# Patient Record
Sex: Female | Born: 1975 | Race: Black or African American | Hispanic: No | Smoking: Former smoker
Health system: Southern US, Community
[De-identification: ages and names within clinical notes are randomized; demographics above are authoritative.]

## PROBLEM LIST (undated history)

## (undated) DIAGNOSIS — R61 Generalized hyperhidrosis: Secondary | ICD-10-CM

## (undated) DIAGNOSIS — I10 Essential (primary) hypertension: Secondary | ICD-10-CM

## (undated) DIAGNOSIS — E039 Hypothyroidism, unspecified: Secondary | ICD-10-CM

## (undated) DIAGNOSIS — R42 Dizziness and giddiness: Secondary | ICD-10-CM

## (undated) DIAGNOSIS — I4891 Unspecified atrial fibrillation: Secondary | ICD-10-CM

## (undated) DIAGNOSIS — R110 Nausea: Secondary | ICD-10-CM

## (undated) DIAGNOSIS — D649 Anemia, unspecified: Secondary | ICD-10-CM

## (undated) DIAGNOSIS — R0602 Shortness of breath: Secondary | ICD-10-CM

## (undated) DIAGNOSIS — Z8659 Personal history of other mental and behavioral disorders: Secondary | ICD-10-CM

## (undated) DIAGNOSIS — E042 Nontoxic multinodular goiter: Secondary | ICD-10-CM

## (undated) DIAGNOSIS — F319 Bipolar disorder, unspecified: Secondary | ICD-10-CM

## (undated) HISTORY — DX: Bipolar disorder, unspecified: F31.9

## (undated) HISTORY — DX: Nausea: R11.0

## (undated) HISTORY — DX: Generalized hyperhidrosis: R61

## (undated) HISTORY — DX: Unspecified atrial fibrillation: I48.91

## (undated) HISTORY — DX: Nontoxic multinodular goiter: E04.2

## (undated) HISTORY — DX: Personal history of other mental and behavioral disorders: Z86.59

## (undated) HISTORY — DX: Dizziness and giddiness: R42

## (undated) HISTORY — DX: Shortness of breath: R06.02

---

## 2000-11-11 ENCOUNTER — Emergency Department (HOSPITAL_COMMUNITY): Admission: EM | Admit: 2000-11-11 | Discharge: 2000-11-11 | Payer: Self-pay | Admitting: Emergency Medicine

## 2001-02-02 ENCOUNTER — Emergency Department (HOSPITAL_COMMUNITY): Admission: EM | Admit: 2001-02-02 | Discharge: 2001-02-02 | Payer: Self-pay | Admitting: *Deleted

## 2004-03-20 ENCOUNTER — Emergency Department (HOSPITAL_COMMUNITY): Admission: EM | Admit: 2004-03-20 | Discharge: 2004-03-20 | Payer: Self-pay | Admitting: Emergency Medicine

## 2004-06-24 ENCOUNTER — Emergency Department (HOSPITAL_COMMUNITY): Admission: EM | Admit: 2004-06-24 | Discharge: 2004-06-24 | Payer: Self-pay | Admitting: Emergency Medicine

## 2005-05-19 ENCOUNTER — Inpatient Hospital Stay (HOSPITAL_COMMUNITY): Admission: RE | Admit: 2005-05-19 | Discharge: 2005-05-22 | Payer: Self-pay | Admitting: Psychiatry

## 2005-05-19 ENCOUNTER — Ambulatory Visit: Payer: Self-pay | Admitting: Psychiatry

## 2005-08-27 ENCOUNTER — Ambulatory Visit (HOSPITAL_COMMUNITY): Payer: Self-pay | Admitting: Psychiatry

## 2005-10-14 ENCOUNTER — Emergency Department (HOSPITAL_COMMUNITY): Admission: EM | Admit: 2005-10-14 | Discharge: 2005-10-14 | Payer: Self-pay | Admitting: Emergency Medicine

## 2005-10-27 ENCOUNTER — Ambulatory Visit (HOSPITAL_COMMUNITY): Payer: Self-pay | Admitting: Psychiatry

## 2006-05-12 ENCOUNTER — Ambulatory Visit: Payer: Self-pay | Admitting: Psychiatry

## 2006-05-12 ENCOUNTER — Inpatient Hospital Stay (HOSPITAL_COMMUNITY): Admission: AD | Admit: 2006-05-12 | Discharge: 2006-05-14 | Payer: Self-pay | Admitting: Psychiatry

## 2006-07-10 ENCOUNTER — Inpatient Hospital Stay (HOSPITAL_COMMUNITY): Admission: EM | Admit: 2006-07-10 | Discharge: 2006-07-14 | Payer: Self-pay | Admitting: Emergency Medicine

## 2006-07-14 ENCOUNTER — Inpatient Hospital Stay (HOSPITAL_COMMUNITY): Admission: AD | Admit: 2006-07-14 | Discharge: 2006-07-21 | Payer: Self-pay | Admitting: *Deleted

## 2006-07-14 ENCOUNTER — Ambulatory Visit: Payer: Self-pay | Admitting: *Deleted

## 2006-08-04 ENCOUNTER — Inpatient Hospital Stay (HOSPITAL_COMMUNITY): Admission: RE | Admit: 2006-08-04 | Discharge: 2006-08-25 | Payer: Self-pay | Admitting: *Deleted

## 2006-08-04 ENCOUNTER — Other Ambulatory Visit: Payer: Self-pay | Admitting: Emergency Medicine

## 2006-08-04 ENCOUNTER — Ambulatory Visit: Payer: Self-pay | Admitting: *Deleted

## 2006-08-26 ENCOUNTER — Emergency Department (HOSPITAL_COMMUNITY): Admission: EM | Admit: 2006-08-26 | Discharge: 2006-08-26 | Payer: Self-pay | Admitting: Emergency Medicine

## 2006-08-28 ENCOUNTER — Ambulatory Visit (HOSPITAL_COMMUNITY): Payer: Self-pay | Admitting: Psychiatry

## 2006-09-05 ENCOUNTER — Emergency Department (HOSPITAL_COMMUNITY): Admission: EM | Admit: 2006-09-05 | Discharge: 2006-09-06 | Payer: Self-pay | Admitting: Emergency Medicine

## 2006-09-07 ENCOUNTER — Ambulatory Visit (HOSPITAL_COMMUNITY): Payer: Self-pay | Admitting: Psychiatry

## 2007-04-30 ENCOUNTER — Emergency Department (HOSPITAL_COMMUNITY): Admission: EM | Admit: 2007-04-30 | Discharge: 2007-04-30 | Payer: Self-pay | Admitting: Emergency Medicine

## 2007-12-06 ENCOUNTER — Emergency Department (HOSPITAL_COMMUNITY): Admission: EM | Admit: 2007-12-06 | Discharge: 2007-12-06 | Payer: Self-pay | Admitting: Emergency Medicine

## 2008-04-12 ENCOUNTER — Emergency Department (HOSPITAL_COMMUNITY): Admission: EM | Admit: 2008-04-12 | Discharge: 2008-04-12 | Payer: Self-pay | Admitting: Emergency Medicine

## 2009-06-07 ENCOUNTER — Emergency Department (HOSPITAL_COMMUNITY): Admission: EM | Admit: 2009-06-07 | Discharge: 2009-06-07 | Payer: Self-pay | Admitting: Emergency Medicine
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
## 2010-07-16 NOTE — Consult Note (Signed)
NAMECALIANN, Ashley Jones NO.:  [id number]   MEDICAL RECORD NO.:  [id number]          PATIENT TYPE:  INP   LOCATION:  1405                         FACILITY:  [facility]   PHYSICIAN:  Antonietta Breach, M.D.  DATE OF BIRTH:  Jun 28, 1975   DATE OF CONSULTATION:  07/13/2006  DATE OF DISCHARGE:                                 CONSULTATION   CONSULTATION FOLLOWUP:  Ashley Jones is no longer agitated.  She is cooperative with bedside  care.  She continues to have judgment impairment.  She continues to have  grandiose delusions and some slight thought disorganization.  She is  taking her medicines cooperatively.  She is not having any adverse  medication effects.   She last required Ativan at 2 mg last night.   WBC 7.9, hemoglobin 11, platelet count 270. BUN 3, creatinine 0.7,  calcium 8.8.  Magnesium 2.1.  TSH is slightly decreased at 0.158.   PHYSICAL EXAMINATION:  VITAL SIGNS: Temperature 98.3, pulse 91,  respirations 79,. blood pressure 129/94.  O2 saturation on room air 98%.   MENTAL STATUS EXAM:  Ashley Jones has intact eye contact.  She is  mildly disheveled in appearance.  Her affect is flat.  Thought process  is partially disorganized at times but overall coherent.  On thought  content, she has grandiosity as mentioned.  Her judgment is still  impaired.  She is oriented to all spheres.  Her memory is intact.  She  does not have any thoughts of harming herself or others.   ASSESSMENT:  1. (293.82) psychotic disorder not otherwise specified.  2. Rule out (296.80) bipolar disorder not otherwise specified.   RECOMMENDATIONS:  1. Would continue the Depakote at 500 mg b.i.d. as her primary mood      stabilizer.  2. Would continue the Zyprexa trial at 10 mg nightly for anti-      psychosis.  3. The husband has stated that he cannot be at home with her;      therefore, the option of intensive outpatient at this time and the      patient going home is not recommended.   Would recommend that the      patient be admitted to a psychiatric unit for further treatment      until she can perform her ADLs independently and reside at home      independently.  At this point, she would be at risk for self      neglect.  However, she has improved.      Antonietta Breach, M.D.  Electronically Signed     JW/MEDQ  D:  07/13/2006  T:  07/13/2006  Job:  [id number]

## 2010-07-16 NOTE — Discharge Summary (Signed)
Ashley Jones, Ashley Jones NO.:  [id number]   MEDICAL RECORD NO.:  [id number]          PATIENT TYPE:  IPS   LOCATION:  0405                          FACILITY:  [facility]   PHYSICIAN:  Jasmine Pang, M.D. DATE OF BIRTH:  03-12-1975   DATE OF ADMISSION:  07/14/2006  DATE OF DISCHARGE:  07/20/2006                               DISCHARGE SUMMARY   IDENTIFYING INFORMATION:  A 35 year old married African American female  who was identified on a voluntary basis on Jul 14, 2006.   HISTORY OF PRESENT ILLNESS:  The patient presented with disorganized  thoughts and confusion.  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  She is asking for supplies to  write down her thoughts.  She is unable to attend for the interview and  reports that her only physician is God.  She has previously followed  by Dr. Lolly Mustache in the past at [facility].  This is one of several [facility]  admissions for her.  She has a history of bipolar disorder with manic  features.  She previously was stabilized on Depakote 500 mg p.o. b.i.d.  and Zyprexa 10 mg p.o. at bedtime.   PAST MEDICAL HISTORY:  The patient has a past medical history of anemia  and UTIs; none now.   ALLERGIES:  No known drug allergies.   PHYSICAL EXAMINATION:  The patient's physical exam was done at the  hospital prior to admission.  She was disheveled, obese [place]  female who was in no acute medical or physical distress.   LABORATORY DATA:  Admission laboratories were done in the ED prior to  admission.  These were reviewed by the ED physician.   HOSPITAL COURSE:  Upon admission the patient was started on her home  medications of Depakote 500 mg p.o. b.i.d., Zyprexa 10 mg p.o. at  bedtime, Catapres 1.1 mg p.o. b.i.d., Ativan 1 mg q.6h. p.r.n. anxiety,  and Haldol 2 mg q.6h. p.r.n. anxiety.  On Jul 15, 2006, Zyprexa was  increased  to 15 mg p.o. at bedtime.  An a.m. Depakote level was obtained  which was 75.7 (50-100).  The patient tolerated her medications well  with no significant side effects.  During the hospitalization, the  patient was friendly and cooperative.  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  She discussed conflict with  her husband about her music.  She felt he was being judgmental because  he was disputing her claims.  There was some disorganized thinking.  An  a.m. Depakote level upon admission was 97.4 and (50-100).  Hepatic  profile and CBC were within normal limits.  The olanzapine was increased  to 15 mg p.o. at bedtime from 10 mg p.o. at bedtime. The patient began  to clear on Jul 17, 2006.  She was more coherent.  Her husband was  interested in having her come home soon.  There was no suicidal or  homicidal ideation.  No auditory  or visual hallucinations.  There was no  more delusional talk about being a Nurse, learning disability.  On Jul 18, 2006,  the patient was excited about her discharge on Jul 20, 2006.  She states  her husband knows she is doing better.  There was no change in treatment  plan.  The patient continued to do well on Jul 19, 2006.  On Jul 20, 2006, mental status had improved markedly from admission.  She was  friendly, cooperative, with good eye contact.  Speech was normal rate  and flow.  Psychomotor activity was within normal limits.  The mood was  euthymic.  Affect wide range.  No suicidal or homicidal ideation.  No  thoughts of self injurious behavior.  No auditory or visual  hallucinations.  No paranoia or delusions.  Thoughts were logical and  goal-directed.  Thought content had no predominant theme.  Cognitive was  grossly back to baseline.  It was felt the patient was safe to be  discharged today to her husband.   DISCHARGE DIAGNOSES:  AXIS I:  Bipolar disorder, manic severe with  psychosis.  AXIS II:  None.  AXIS III:  None  AXIS  IV:  Moderate (problems with psychosocial environment, problems  with primary support group, burden of psychiatric illness).  AXIS V: GAF  upon discharge was 50.  GAF upon admission was 35.  GAF highest past  year was 65.   DISCHARGE/PLAN:  There were no specific activity level or dietary  restrictions.  The patient will be seen at the intensive outpatient  program at [facility].  She will start this tomorrow on Jul 21, 2006.   DISCHARGE MEDICATIONS:  1. Depakote 500 mg tablets twice daily.  2. Clonidine 0.1 mg twice daily.  3. Zyprexa 15 mg at bedtime.      Jasmine Pang, M.D.  Electronically Signed     BHS/MEDQ  D:  07/20/2006  T:  07/20/2006  Job:  [id number]

## 2010-07-16 NOTE — Discharge Summary (Signed)
NAMEJOANELL, Ashley Jones NO.:  [id number]   MEDICAL RECORD NO.:  [id number]          PATIENT TYPE:  IPS   LOCATION:  0305                          FACILITY:  [facility]   PHYSICIAN:  Anselm Jungling, MD  DATE OF BIRTH:  [birth year]   DATE OF ADMISSION:  08/04/2006  DATE OF DISCHARGE:  08/25/2006                               DISCHARGE SUMMARY   IDENTIFYING DATA/REASON FOR ADMISSION:  The patient is a 35 year old  African-American female, who had recently had a course of treatment at  our inpatient psychiatric service.  She returned with decompensation of  psychosis and mood disorder.  At the time of her previous discharge she  had been stable and discharged on a regimen of Zyprexa, Depakote and  Clonidine.  The patient reported that she had gotten mixed up on  taking her medication and following this had had increasing symptoms  including auditory hallucinations, and severe thought disorganization.  She denied any illicit drug or alcohol use, and was not suicidal.  Please refer to the admission note for further details pertaining to the  symptoms, circumstances and history that led to her hospitalization.  She was given an initial Axis I diagnosis of schizo-affective disorder  NOS.   MEDICAL AND LABORATORY:  The patient was again medically and physically  assessed by the psychiatric nurse practitioner.  Please refer to  hospital course below for further details.   HOSPITAL COURSE:  The patient was admitted to the adult inpatient  psychiatric service.  She presented as a tired, disheveled woman who was  sleepy, but fully oriented.  She was pleasant, but sad, depressed, with  little energy.  She was making no overtly delusional statements, but  admitted to auditory hallucinations.  She wanted help, but also appeared  somewhat ambivalent about staying.  She was restarted on her previous  medications on which she had done well.  The patient then appeared to,  over the next  1-2 weeks, actually experience an exacerbation of her  symptoms and worsening, with an additional complaint of racing  thoughts and insomnia.  Zyprexa was increased further to 15 mg at  bedtime.   The patient's response to medication treatment at this time was slow and  lengthy.  After she did not seem to respond to increasing doses of  Zyprexa, it was felt that she would probably require a different  antipsychotic medication for stabilization.  Because of this, she was  eventually placed on a trial of Abilify.  Prior to this her condition  was such that she required 1:1 observation because she was psychotic and  thought disordered to the point that she was barely able to take care of  ADL's and appeared to be a risk for falling.  Depakote therapy was  continued and Depakote levels were monitored.   The patient appeared to have a gradual response to Abilify, and as she  did, her auditory hallucinations diminished, she experienced less in the  way of thought disorganization and confusion, and was more in touch with  reality.  She was appropriate for discharge on the 22nd hospital day.  AFTER CARE:  The patient was to follow up with Dr. Lolly Mustache in the [facility] outpatient clinic.  She was to have an appointment with him 3 days  following discharge.   The patient's family had been involved throughout her inpatient stay,  and they were given careful instructions on the patient's needs for  follow up following discharge.   DISCHARGE MEDICATIONS:  1. Depakote ER 1000 mg q.h.s.  2. Toprol XL 25 mg daily.  3. Cogentin 1 mg b.i.d.  4. Clonidine TTS patch every 7 days, next due 09/02/2006.  5. Abilify 30 mg q.h.s.   DISCHARGE DIAGNOSES:  AXIS I.  Schizo-affective disorder NOS.  AXIS II.  Deferred.  AXIS III.  History of hypertension.  AXIS IV.  Stressors-severe.  AXIS V.  GAF on discharge 50.      Anselm Jungling, MD  Electronically Signed     SPB/MEDQ  D:  09/23/2006  T:   09/23/2006  Job:  [id number]

## 2010-07-16 NOTE — Discharge Summary (Signed)
Ashley Jones, SCHMUHL NO.:  [id number]   MEDICAL RECORD NO.:  [id number]          PATIENT TYPE:  IPS   LOCATION:  0405                          FACILITY:  [facility]   PHYSICIAN:  Elliot Cousin, M.D.    DATE OF BIRTH:  08-Jan-1976   DATE OF ADMISSION:  07/14/2006  DATE OF DISCHARGE:  07/14/2006                               DISCHARGE SUMMARY   DISCHARGE DIAGNOSES:  1. Acute psychosis with agitation.  The patient was restarted on      Zyprexa and Depakote.  She admitted to being noncompliant for at      least three weeks.      a.     The patient was still experiencing auditory hallucinations       at the time of discharge to [facility].  2. History of paranoid schizophrenia and bipolar disorder.  3. Leukocytosis.  The patient's white blood cell count was 19.4 on      admission and 7.9 at the time of hospital discharge.  4. Mildly prolonged Q-T interval of 378 ms.  5. Hypertension.  6. Hypokalemia.  7. TSH pending.   DISCHARGE MEDICATIONS:  (Medications the patient was treated with during  the hospitalization).  1. Clonidine 0.1 mg b.i.d.  2. Toprol XL 25 mg daily.  3. Depakote 500 mg b.i.d.  4. Zyprexa 10 mg daily.  5. Dilaudid 0.5 mg IV q.4h. p.r.n.  6. Ativan 2 mg IV/IM q.4h. p.r.n.  7. Haldol 2-4 mg IV/IM q.4h. p.r.n.  8. Potassium chloride 40 mEq p.o. x1.   DISCHARGE DISPOSITION:  Patient was discharged to [facility] on  Jul 14, 2006, as ordered by psychiatrist, Dr. Jeanie Sewer.  She was  hemodynamically stable.   HOSPITAL COURSE:  1. ACUTE PSYCHOSIS WITH AGITATION, HISTORY OF PARANOID SCHIZOPHRENIA      AND BIPOLAR DISORDER:  Prior to admission, the patient arrived to      the emergency department in police custody.  It was difficult      obtaining history from the patient because she was confused and      somewhat psychotic; however, according to the report provided by      the police officers, the patient was displaying psychotic behavior      at home.  Her husband had noted that the patient had not taken any      of her medications in three weeks.  Apparently, en route to the      hospital, the patient attempted to jump out of the car.   On admission, the patient was agitated and mildly hypertensive with a  blood pressure of 161/70 and mildly tachycardic with a heart rate of 115  beats per minute.  She was oxygenating at 97% on room air.  She was  placed in restraints for safety.  She was restarted on Zyprexa 10 mg  nightly and Depakote 500 mg b.i.d.  A 24-hour sitter was also ordered.   On admission, her alcohol level was less than 5, urine pregnancy test  was negative, and urine drug screen was negative.  Her TSH was ordered  during the weekend; however,  it was pending at the time of hospital  discharge.  Subsequently, a valproic acid level was ordered and was low  at 26.1, consistent with noncompliance.   Over the course of the hospitalization, the patient became less  agitated.  A psychiatric consultation was obtained.  Dr. Jeanie Sewer,  psychiatrist, evaluated the patient and agreed with current management.  He recommended increasing the Zyprexa if needed for worsening psychosis.  The follow-up psychiatric consultation was provided by Dr. Dub Mikes on Jul 12, 2006.  Per his assessment, she was responding to internal stimuli  continuously.  Dr. Jeanie Sewer again followed up with the patient on May  12th and recommended inpatient psychiatric admission for ongoing  treatment of bipolar disorder and psychosis.   The patient was therefore transferred to [facility] on Jul 14, 2006.  She was medically cleared for transfer.   1. LEUKOCYTOSIS:  On admission, the patient's white blood cell count      was elevated at 19.2.  For further evaluation, an urinalysis and      chest x-ray were ordered.  The urinalysis was negative.  The chest      x-ray revealed no acute cardiopulmonary disease.  The patient had      no complaints  of subjective fever, chills, cough, diarrhea, or      painful urination.  She was given, however, one empiric dose of      azithromycin by the emergency department physician.  Over the      course of the hospitalization, the patient's leukocytosis resolved.      It was 7.9 prior to hospital discharge.   1. MILDLY PROLONGED Q-T INTERVAL:  As indicated above, the patient was      tachycardic on admission.  Her EKG revealed nonspecific T wave      changes and a prolonged Q-T interval of 378 ms.  Her magnesium      level was assessed and found to be within normal limits at 2.1.      The patient had no complaints of chest pain during the      hospitalization.   1. HYPERTENSION:  The patient's blood pressures were moderately      elevated during the hospital course.  She was started on clonidine      0.1 mg b.i.d. and Toprol XL 25 mg daily.  Prior to hospital      discharge, she was normotensive.   1. HYPOKALEMIA:  The patient's serum potassium was low at 3.1 at the      time of admission.  She was repleted with potassium chloride in the      IV fluids and orally.  Prior to hospital discharge, her potassium      improved to 4.2.   DISCHARGE LABORATORIES:  TSH pending.      Elliot Cousin, M.D.  Electronically Signed     DF/MEDQ  D:  07/14/2006  T:  07/14/2006  Job:  [id number]

## 2010-07-19 NOTE — H&P (Signed)
[name], Jones NO.:  [id number]   MEDICAL RECORD NO.:  [id number]          PATIENT TYPE:  IPS   LOCATION:  0300                          FACILITY:  [facility]   PHYSICIAN:  Anselm Jungling, MD  DATE OF BIRTH:  December 07, 1975   DATE OF ADMISSION:  05/12/2006  DATE OF DISCHARGE:  05/14/2006                       PSYCHIATRIC ADMISSION ASSESSMENT   IDENTIFICATION:  This is a 35 year old married African-American female  voluntarily admitted on May 12, 2006.   HISTORY OF PRESENT ILLNESS:  The patient presents with a history of  depression.  States her husband is not supportive.  She has been feeling  very irritable, having racing thoughts and threatening behaviors or  suicidal thoughts.  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  She denies any psychotic symptoms.  Denies any  substance abuse.  Has been off her medications for least three months.   PAST PSYCHIATRIC HISTORY:  Second admission to [facility].  Was  seeing Dr. Lolly Mustache for outpatient mental health services.  Last visit was  in September of 2007.   SOCIAL HISTORY:  This is a 35 year old married African-American female,  married for two years, has four children, oldest is 84, youngest is 34.  Children are currently with the father.  She is unemployed.  No legal  charges.   FAMILY HISTORY:  Cousin with bipolar.   ALCOHOL/DRUG HISTORY:  Nonsmoker.  Denies any alcohol or drug use.   PRIMARY CARE PHYSICIAN:  Dr. Juliann Pulse in [place].   MEDICAL PROBLEMS:  History of anemia and has a current urinary tract  infection.   MEDICATIONS:  Is taking none currently but has been on Zyprexa 10 mg at  bedtime in the past prescribed by Dr. Juliann Pulse.   DRUG ALLERGIES:  No known allergies.   PHYSICAL EXAMINATION:  This is an overweight female in no acute  distress.  She was fully assessed at the emergency department.  She  attempted to leave.  Her temperature is 97.6, heart rate 102,  respirations 18, blood pressure 158/99, height 5 feet 7 inches tall,  weight 234 pounds.   LABORATORY DATA:  Hemoglobin 11.4, hematocrit 34.2, sodium 139,  potassium 3.4, glucose 139, BUN is 5.  Alcohol level was 6.  Urine  pregnancy test is negative.  Urine drug screen is negative.  Urinalysis  showed wbc of 7-10.  The patient is having some dysuria.  TSH is 0.224.   MENTAL STATUS EXAM:  Fully alert, cooperative, good eye contact.  She is  currently dressed in scrubs, somewhat disheveled.  Speech is clear.  Mood is depressed.  The patient is pleasant.  Thought processes are  coherent, goal-directed.  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  Cognitive function intact.  Memory is good.  Judgment is fair.  Insight  is fair.   DIAGNOSES:  AXIS I:  Bipolar disorder, type 1.  AXIS II:  Deferred.  AXIS III:  Urinary tract infection and anemia.  AXIS IV:  Problems with primary support group, other psychosocial  problems  related to burden of illness.  AXIS V:  Current 40.   PLAN:  Contract for safety.  Stabilize mood and thinking.  Will have  Depakote and Zyprexa available for mood stabilization.  Will have a  family session with her husband.  The patient is to be medication  compliant.  Will order Cipro right now for urinary tract infection.  The  patient is to follow up with the primary care Yifan Auker if she does not  improve.  Also to follow up in regards to her anemia.  The patient  reports she has been on some iron pills in the past but has been  noncompliant.  [facility] is to obtain follow-up.   TENTATIVE LENGTH OF STAY:  Four to five days.      Landry Corporal, N.P.      Anselm Jungling, MD  Electronically Signed    JO/MEDQ  D:  05/14/2006  T:  05/15/2006  Job:  [id number]

## 2010-07-19 NOTE — Discharge Summary (Signed)
NAME:  Ashley Jones, Ashley Jones NO.:  [id number]   MEDICAL RECORD NO.:  [id number]          PATIENT TYPE:  IPS   LOCATION:  0406                          [facility]ILITY:  [facility]   PHYSICIAN:  Jeanice Lim, M.D. DATE OF BIRTH:  1975/08/31   DATE OF ADMISSION:  05/19/2005  DATE OF DISCHARGE:  05/22/2005                                 DISCHARGE SUMMARY   IDENTIFYING DATA:  This is a 35 year old married African-American female  voluntarily admitted with a history of manic behavior, wandering from home,  leaving 68-month-old unattended, had not been sleeping for one week.  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  Having little  insight regarding her behavior but aware that something was wrong.  Postpartum six months.   PAST PSYCHIATRIC HISTORY:  First [facility] admission.   MEDICATIONS:  Zoloft.   ALLERGIES:  No known drug allergies.   PHYSICAL EXAMINATION:  Physical and neurologic exam within normal limits.   REVIEW OF SYSTEMS:  Noncontributory.  Consistent with delivery six months  ago.   MENTAL STATUS EXAM:  Alert, cooperative.  Poor eye contact.  Speech clear,  no pressure or racing thoughts.  No psychomotor abnormalities.  Mood tired.  Affect laughing inappropriately and isolative, seclusive in room, under the  covers at times, often not removing blanket to even answer questions.  Thought process appearing to respond to internal stimulus.  The patient did  admit to voices.  Cognition was intact but judgment and insight were poor,  severely impaired and short-term memory and concentration were impaired and  patient was a poor historian due to the severity of her thought disorder and  mood instability.   ADMISSION DIAGNOSES:  AXIS I:  Bipolar disorder, manic with psychotic  features.  AXIS II:  Deferred.  AXIS III:  None.  AXIS IV:  Moderate to severe (stressors with recent delivery and 77-month-old  she has been unable to care for  recently due to her mental illness).  AXIS V:  30/55.   HOSPITAL COURSE:  The patient was admitted and ordered routine p.r.n.  medications and underwent further monitoring.  Was encouraged to participate  in individual, group and milieu therapy.  Risk/benefit ratio and alternative  treatments regarding medications were discussed.  Family session requesting  for after planning and to get further collateral information.  The patient  initially felt foggy but had a dramatic response when started on  medications, reporting improvement, tolerating medications and felt much  more like herself and was able to think clearly, be more goal directed and  appropriate on the unit and now seemed to have insight regarding the fact  that this was a mental illness and that she needed to stay on medications.   CONDITION ON DISCHARGE:  The patient was significantly improved by the time  of discharge.  There was no suicidal or homicidal ideation, no overt  psychotic symptoms.  She was tolerating medications without side effects.  There was improved judgment and insight and coping skills.  She was given  medication education review again at the time  of discharge.   DISCHARGE MEDICATIONS:  1.  Depakote ER 500 mg q.a.m. and at 8 p.m.  2.  Zyprexa Zydis 10 mg q.h.s.  3.  Ambien 10 mg q.h.s. p.r.n.   FOLLOW UP:  The patient was discharged to follow up with Dr. Lolly Mustache on April  9th at 11 a.m. and for outpatient therapy as well.  Safety plan and crisis  plan were explained and patient understood what to do if symptoms should  recur or side effects from medications should occur before follow-up  appointment.  The patient was discharged in improved condition.   DISCHARGE DIAGNOSES:  AXIS I:  Bipolar disorder, manic with psychotic  features.  AXIS II:  Deferred.  AXIS III:  None.  AXIS IV:  Moderate to severe (stressors with recent delivery and 87-month-old  she has been unable to care for recently due to her  mental illness).  AXIS V:  GAF on discharge 50-55.      Jeanice Lim, M.D.  Electronically Signed     JEM/MEDQ  D:  06/04/2005  T:  06/06/2005  Job:  [id number]

## 2010-07-28 ENCOUNTER — Inpatient Hospital Stay (HOSPITAL_COMMUNITY)
Admission: EM | Admit: 2010-07-28 | Discharge: 2010-08-02 | DRG: 309 | Disposition: A | Discharge status: Home / Self Care | Payer: Self-pay | Attending: Cardiology | Admitting: Cardiology

## 2010-07-28 ENCOUNTER — Emergency Department (HOSPITAL_COMMUNITY): Payer: Self-pay

## 2010-07-28 DIAGNOSIS — F319 Bipolar disorder, unspecified: Secondary | ICD-10-CM | POA: Diagnosis present

## 2010-07-28 DIAGNOSIS — Z87891 Personal history of nicotine dependence: Secondary | ICD-10-CM

## 2010-07-28 DIAGNOSIS — I4891 Unspecified atrial fibrillation: Secondary | ICD-10-CM

## 2010-07-28 DIAGNOSIS — E042 Nontoxic multinodular goiter: Secondary | ICD-10-CM | POA: Diagnosis present

## 2010-07-28 DIAGNOSIS — F200 Paranoid schizophrenia: Secondary | ICD-10-CM | POA: Diagnosis present

## 2010-07-28 DIAGNOSIS — I824Y9 Acute embolism and thrombosis of unspecified deep veins of unspecified proximal lower extremity: Secondary | ICD-10-CM | POA: Diagnosis present

## 2010-07-28 DIAGNOSIS — I10 Essential (primary) hypertension: Secondary | ICD-10-CM | POA: Diagnosis present

## 2010-07-28 DIAGNOSIS — Z7982 Long term (current) use of aspirin: Secondary | ICD-10-CM

## 2010-07-28 DIAGNOSIS — Z7901 Long term (current) use of anticoagulants: Secondary | ICD-10-CM

## 2010-07-28 HISTORY — DX: Essential (primary) hypertension: I10

## 2010-07-28 LAB — CBC
HCT: 32.9 % — ABNORMAL LOW (ref 36.0–46.0)
Hemoglobin: 11.4 g/dL — ABNORMAL LOW (ref 12.0–15.0)
MCH: 31.8 pg (ref 26.0–34.0)
MCHC: 34.7 g/dL (ref 30.0–36.0)
MCV: 91.9 fL (ref 78.0–100.0)
Platelets: 201 10*3/uL (ref 150–400)
RBC: 3.58 MIL/uL — ABNORMAL LOW (ref 3.87–5.11)
RDW: 11.9 % (ref 11.5–15.5)
WBC: 6.7 10*3/uL (ref 4.0–10.5)

## 2010-07-28 LAB — RAPID URINE DRUG SCREEN, HOSP PERFORMED
Amphetamines: NOT DETECTED
Barbiturates: NOT DETECTED
Opiates: NOT DETECTED

## 2010-07-28 LAB — CARDIAC PANEL(CRET KIN+CKTOT+MB+TROPI)
Total CK: 177 U/L (ref 7–177)
Troponin I: <0.3 ng/mL (ref <0.30)

## 2010-07-28 LAB — BASIC METABOLIC PANEL
BUN: 10 mg/dL (ref 6–23)
CO2: 28 mEq/L (ref 19–32)
Chloride: 102 mEq/L (ref 96–112)
Creatinine, Ser: 0.81 mg/dL (ref 0.4–1.2)

## 2010-07-29 ENCOUNTER — Inpatient Hospital Stay (HOSPITAL_COMMUNITY): Payer: Self-pay

## 2010-07-29 ENCOUNTER — Encounter (HOSPITAL_COMMUNITY): Payer: Self-pay | Admitting: Radiology

## 2010-07-29 DIAGNOSIS — M7989 Other specified soft tissue disorders: Secondary | ICD-10-CM

## 2010-07-29 LAB — BASIC METABOLIC PANEL
BUN: 8 mg/dL (ref 6–23)
Chloride: 104 mEq/L (ref 96–112)
GFR calc Af Amer: >60 mL/min (ref >60)
Potassium: 3.5 mEq/L (ref 3.5–5.1)
Sodium: 136 mEq/L (ref 135–145)

## 2010-07-29 LAB — CBC
Hemoglobin: 12.1 g/dL (ref 12.0–15.0)
MCH: 31.8 pg (ref 26.0–34.0)
MCHC: 34.8 g/dL (ref 30.0–36.0)

## 2010-07-29 LAB — LIPID PANEL
Cholesterol: 130 mg/dL (ref 0–200)
HDL: 72 mg/dL (ref >39)
LDL Cholesterol: 52 mg/dL (ref 0–99)
Total CHOL/HDL Ratio: 1.8 RATIO
Triglycerides: 31 mg/dL (ref <150)

## 2010-07-29 LAB — PROTIME-INR
INR: 1.07 (ref 0.00–1.49)
INR: 0.96 (ref 0.00–1.49)
Prothrombin Time: 13 seconds (ref 11.6–15.2)

## 2010-07-29 LAB — D-DIMER, QUANTITATIVE: D-Dimer, Quant: 0.9 ug/mL-FEU — ABNORMAL HIGH (ref 0.00–0.48)

## 2010-07-29 LAB — CARDIAC PANEL(CRET KIN+CKTOT+MB+TROPI): Relative Index: 1.8 (ref 0.0–2.5)

## 2010-07-29 MED ORDER — IOHEXOL 300 MG/ML  SOLN: 100.0000 mL | Freq: Once | INTRAMUSCULAR | Status: AC | PRN

## 2010-07-30 ENCOUNTER — Inpatient Hospital Stay (HOSPITAL_COMMUNITY): Payer: Self-pay

## 2010-07-30 DIAGNOSIS — I4891 Unspecified atrial fibrillation: Secondary | ICD-10-CM

## 2010-07-30 HISTORY — PX: TRANSTHORACIC ECHOCARDIOGRAM: SHX275

## 2010-07-30 LAB — BASIC METABOLIC PANEL
BUN: 8 mg/dL (ref 6–23)
Calcium: 8.7 mg/dL (ref 8.4–10.5)
Creatinine, Ser: 0.6 mg/dL (ref 0.4–1.2)
GFR calc Af Amer: >60 mL/min (ref >60)

## 2010-07-30 LAB — PROTIME-INR: INR: 1.08 (ref 0.00–1.49)

## 2010-07-30 LAB — CBC
Platelets: 202 10*3/uL (ref 150–400)
RBC: 3.74 MIL/uL — ABNORMAL LOW (ref 3.87–5.11)
WBC: 4.7 10*3/uL (ref 4.0–10.5)

## 2010-07-30 LAB — GLUCOSE, CAPILLARY: Glucose-Capillary: 101 mg/dL — ABNORMAL HIGH (ref 70–99)

## 2010-07-30 LAB — T3, FREE: T3, Free: 2.6 pg/mL (ref 2.3–4.2)

## 2010-07-30 LAB — HEPARIN LEVEL (UNFRACTIONATED): Heparin Unfractionated: 0.69 IU/mL (ref 0.30–0.70)

## 2010-07-30 LAB — T4, FREE: Free T4: 1.05 ng/dL (ref 0.80–1.80)

## 2010-07-31 LAB — HEPARIN LEVEL (UNFRACTIONATED): Heparin Unfractionated: 0.63 IU/mL (ref 0.30–0.70)

## 2010-07-31 LAB — CBC
MCV: 91.4 fL (ref 78.0–100.0)
Platelets: 203 10*3/uL (ref 150–400)
RDW: 11.9 % (ref 11.5–15.5)
WBC: 5.8 10*3/uL (ref 4.0–10.5)

## 2010-07-31 LAB — PROTIME-INR: INR: 1.1 (ref 0.00–1.49)

## 2010-08-01 LAB — CBC
MCH: 31 pg (ref 26.0–34.0)
MCHC: 33.7 g/dL (ref 30.0–36.0)
MCV: 92 fL (ref 78.0–100.0)
Platelets: 201 10*3/uL (ref 150–400)
RBC: 3.74 MIL/uL — ABNORMAL LOW (ref 3.87–5.11)

## 2010-08-01 LAB — HEPARIN LEVEL (UNFRACTIONATED)
Heparin Unfractionated: 0.81 IU/mL — ABNORMAL HIGH (ref 0.30–0.70)
Heparin Unfractionated: 0.58 IU/mL (ref 0.30–0.70)

## 2010-08-01 NOTE — H&P (Signed)
NAMEHONI, NAME NO.:  [id number]  MEDICAL RECORD NO.:  [id number]           PATIENT TYPE:  I  LOCATION:  2030                         FACILITY:  [facility]  PHYSICIAN:  Cassell Clement, M.D. DATE OF BIRTH:  May 24, 1975  DATE OF ADMISSION:  07/28/2010 DATE OF DISCHARGE:                             HISTORY & PHYSICAL   ADMISSION DIAGNOSIS:  New-onset atrial fibrillation.  PRIMARY CARDIOLOGIST:  Nyra Market, MD  PRIMARY CARE PHYSICIAN:  Urgent care physicians on [place], [place].  HISTORY OF PRESENT ILLNESS:  This is a 35 year old African American female with no prior cardiac history, who while at church became lightheaded and felt her heart pounding.  She states that the air conditioning was out in her church and she became very warm, went into the lobby and began to feel her heart pounding with some mild chest pain and shortness of breath and some diaphoresis.  She states that she just felt she got overheated that she began to have blurry vision and became nauseated.  A church member called EMS.  She was brought to the ER and found to be in AFib RVR.  She was given Cardizem bolus and placed on a Cardizem drip.  She has begun to feel some better without any recurrence of chest pain, shortness of breath, or diaphoresis.  She remains in atrial fibrillation.  The patient has no prior cardiac history.  She does have a prior history of bipolar disorder and paranoid schizophrenia with a hospitalization in 2010, is no longer on any medications at this time, is very stable from psychiatric standpoint at this time.  REVIEW OF SYSTEMS:  Positive for chest pain, some shortness of breath, palpitations, diaphoresis, blurred vision.  All other systems were reviewed and found to be negative unless listed above.  PAST MEDICAL HISTORY: 1. Bipolar disorder. 2. Paranoid schizophrenia with hospitalization in 2010. 3. Hypertension.  PAST SURGICAL  HISTORY:  None.  SOCIAL HISTORY:  Lives in [place] with her cousin.  She is not employed.  She is divorced with 4 children.  She quit smoking 16 years ago.  Negative for EtOH or drug use.  FAMILY HISTORY:  Mother with a myocardial infarction and hypertension who is alive.  Her father is alive with no health histories that she is aware of.  She has one sister and some brothers and she does not know their health status.  CURRENT MEDICATIONS:  She is not taking any medication at this time past.  ALLERGIES:  No known drug allergies.  CURRENT LABORATORY DATA:  Sodium 137, potassium 3.6, chloride 102, CO2 of 28, BUN 10, creatinine 0.81, glucose 102.  Hemoglobin 11.4, hematocrit 32.9, white blood cells 6.7, platelets 201.  Cardiac enzymes; CK 195, MB 3.6, troponin less than 0.30.  EKG revealing atrial fibrillation with a rate of 119 beats per minute.  RADIOLOGY:  Right bibasilar opacity, likely atelectasis.  PHYSICAL EXAMINATION:  VITAL SIGNS:  Blood pressure 110/70, pulse irregular at 110 beats per minute, respirations 18, she is afebrile, O2 sat 100% on room air. GENERAL:  She is awake, alert, oriented, no acute distress. HEENT:  Head is normocephalic and  atraumatic.  She has poor dentition with several missing teeth. NECK:  Supple without thyromegaly.  No carotid bruits.  No JVD is seen. CARDIOVASCULAR:  Irregular rhythm without murmurs, rubs, or gallops. Pulses are 2+ and equal. LUNGS:  There are occasional inspiratory wheezes noted without rales, rhonchi, or cough. ABDOMEN:  Soft, nontender with 2+ bowel sounds. EXTREMITIES:  Without clubbing or cyanosis.  Her right lower leg and ankle appear larger than her left, but no calf tenderness.  Good pulses bilaterally. NEUROLOGIC:  Cranial nerves II through XII are grossly intact.  IMPRESSION: 1. New-onset atrial fibrillation with rapid ventricular response.  We     would continue Cardizem drip.  CHADD score is 1 for  hypertension.     We will check a TSH, echocardiogram for LV function, change to p.o.     Cardizem if she converts and at this time, we will begin a heparin     drip, question need for TE cardioversion if she does not convert     per Dr. Yevonne Pax recommendations.  We will also do D-dimer     secondary to unilateral lower extremity edema to make evaluation     for DVT. 2. History of bipolar schizophrenia.  She is on no medications and     appears to be very stable from a psychiatric perspective at this     time.  Please see Dr. Yevonne Pax handwritten progress note for     more details.     Bettey Mare. Lyman Bishop, NP   ______________________________ Cassell Clement, M.D.    KML/MEDQ  D:  07/28/2010  T:  07/29/2010  Job:  [id number]  Electronically Signed by Joni Reining NP on 07/29/2010 09:13:58 PM Electronically Signed by Cassell Clement M.D. on 08/01/2010 12:48:51 PM

## 2010-08-02 ENCOUNTER — Telehealth: Payer: Self-pay | Admitting: Cardiology

## 2010-08-02 LAB — BASIC METABOLIC PANEL
Calcium: 9.2 mg/dL (ref 8.4–10.5)
GFR calc Af Amer: >60 mL/min (ref >60)
GFR calc non Af Amer: >60 mL/min (ref >60)
Sodium: 136 mEq/L (ref 135–145)

## 2010-08-02 LAB — PROTIME-INR: Prothrombin Time: 17.7 seconds — ABNORMAL HIGH (ref 11.6–15.2)

## 2010-08-02 LAB — CBC
Hemoglobin: 11.5 g/dL — ABNORMAL LOW (ref 12.0–15.0)
RBC: 3.69 MIL/uL — ABNORMAL LOW (ref 3.87–5.11)

## 2010-08-02 LAB — HEPARIN LEVEL (UNFRACTIONATED): Heparin Unfractionated: 0.46 IU/mL (ref 0.30–0.70)

## 2010-08-02 NOTE — Telephone Encounter (Signed)
DANA-PA AT [facility] CALLED SAID DR. BRACKBILL WAS DISCHARGING PATIENT AND WANTING TO SEE HER ON Monday WITH INR CHECK. GAVE INFO TO SUSIE TO SCHEDULE

## 2010-08-05 ENCOUNTER — Encounter: Payer: 59 | Admitting: *Deleted

## 2010-08-05 ENCOUNTER — Encounter: Payer: Self-pay | Admitting: Cardiology

## 2010-08-05 ENCOUNTER — Ambulatory Visit: Payer: 59 | Admitting: Cardiology

## 2010-08-08 NOTE — Discharge Summary (Addendum)
Ashley Jones           ACCOUNT NO.:  [id number]  MEDICAL RECORD NO.:  [id number]           PATIENT TYPE:  I  LOCATION:  2030                         FACILITY:  [facility]  PHYSICIAN:  Ashley Jones, M.D. DATE OF BIRTH:  December 15, 1975  DATE OF ADMISSION:  07/28/2010 DATE OF DISCHARGE:  08/02/2010                              DISCHARGE SUMMARY   DISCHARGE DIAGNOSES: 1. New onset atrial fibrillation with rapid ventricular response,     converted spontaneously. 2. Acute occlusive deep vein thrombosis of the right femoral and     common femoral veins, diagnosed in Jul 29, 2010.     a.     Discharged on Lovenox and Coumadin crossover.     b.     CT angio of the chest on Jul 21, 2010, showing no filling      defects to suggest pulmonary embolism, however, mosaic attenuation      pattern of vasculature favors diagnosis of either obstructive      bronchiolitis or chronic pulmonary embolism. 3. Multinodular goiter by thyroid ultrasound on Jul 30, 2010.     a.     Endocrine consult stating goiters consistent with autonomic      functioning, but not current hyperthyroidism.     b.     Advised recheck of thyroid labs in 3 months and follow up      with Ashley Jones. 4. History of bipolar disorder. 5. Paranoid schizophrenia with hospitalization in 2010, stable     psychiatric status this admission. 6. Hypertension.  HOSPITAL COURSE:  Ashley Jones is a 35 year old female with no prior cardiac history who was in church when she noted her heart was pounding with some associated lightheadedness.  The church member called the EMS and she was brought to the ER and found to be in AFib with RVR.  She was given a Cardizem bolus and placed on a Cardizem drip.  She denied any chest pain, but did have some sensation that she has overheated with blurry vision and became nauseated.  Lab work was checked including urine drug screen which is negative.  She also had a TSH which was suppressed to  0.145.  She also had free T4 and free T3 hormones that were within normal range.  Cardiac enzymes were negative x3.  She had unilateral swelling of her lower extremities and D-dimer was checked with a result of 0.90.  Lower extremity Dopplers did demonstrate an acute DVT of the right lower extremity and she was subsequently continued on heparin to Coumadin crossover.  CT angio was obtained with the above findings.  Within that CT angio, she did have an abnormal thyroid gland.  Thyroid ultrasound was obtained showing the below findings and subsequent endocrine consult was called.  Ashley Jones saw the patient in consultation and felt that the patient's multinodular goiter and thyroid labs were consistent with autonomic functioning, but not current hyperthyroidism.  He could not exclude contributing role in her AF and recommended no further evaluation at this time, but would recheck thyroid labs in 3 months and q.6-12 months or sooner depending on the lab work or if anymore AF or  if any symptoms of hyperthyroidism. The patient did convert to sinus rhythm on her own this admission without antiarrhythmic intervention.  She was maintained on p.o. diltiazem for both rate control and blood pressure control.  Her INR was slowed, so she was seen for evaluation for possible home Lovenox by case management.  Ashley Jones has been able to fill out an assistance program for her.  He has seen and examined the patient today and feels she is stable for discharge on the below medications.  I have discussed her home dose of Coumadin with Pharmacy who recommended 10 mg daily at discharge with her INR check on Monday.  DISCHARGE LABORATORY DATA:  WBC 5.8, hemoglobin 11.5, hematocrit 33.9, and platelet count 217.  D-dimer was 0.95.  TSH 0.145, free T4 of 1.05, free T3 2.65, thyroglobulin antibody less than 20, thyroid peroxidase antibody less than 10.  Urine drug screen negative.  Total cholesterol 130,  triglycerides 31, HDL 72, and LDL 52.  STUDIES: 1. CT angio of the chest on Jul 29, 2010, demonstrated no filling     defects identified in the pulmonary arterial tree to suggest PE.     Mosaic attenuation especially in the left upper lobe along with     airway thickening.  This pattern vasculature and the mosaic     attenuation region favors a diagnosis of either obstructive     bronchiolitis or chronic pulmonary embolus.  No marginal thrombus     is evident in the pulmonary vascular to suggest overt findings of     chronic PE.  Airway thickening in the lungs may also corroborate     with obstructive bronchiolitis.  Cardiomegaly.  Mild dependent     atelectasis in the lower lobe.  Prominent thyroid gland with     nodularity and calcifications.  This has not been worked in the     past, I recommend thyroid ultrasound. 2. Ultrasound of soft tissue of head and neck on Jul 30, 2010, showed     multiple thyroid nodules with thyroid enlarged and inhomogeneous,     most consistent with multinodular goiter.  Dominant nodule in the     left lower pole, 2.6 x 2.4 x 1.9 cm. 3. Chest x-ray on Jul 28, 2010, showed right basilar opacity, likely     atelectasis and cardiomegaly. 4. Two-D echocardiogram on Jul 30, 2010, showed mild LVH.  EF 60-65%.     No wall motion abnormalities.  DISCHARGE MEDICATIONS: 1. Aspirin 81 mg daily. 2. Coumadin 5 mg 2 tablets daily until her INR checked on August 05, 2010. 3. Diltiazem 180 mg daily. 4. Enoxaparin 80 mg subcutaneously q.12 h. 5. Potassium chloride 20 mEq b.i.d. 6. Tylenol Extra Strength 500 mg 2 tablets q.8 h. p.r.n.  DISPOSITION:  Ashley Jones will be discharged in stable condition to home with instruction to increase activity slowly and follow a low- sodium, heart-healthy diet.  She is to keep her IV site clean and dry. She is to call Ashley Jones office when she is discharged and set up with a 23-month thyroid check appointment with repeat  thyroid labs.  She will follow up with Ashley Jones office on Monday, August 05, 2010, for both her INR check and an office visit.  Their office will call her with this appointment as they have to override the appointment given the current schedule.  DURATION OF DISCHARGE ENCOUNTER:  Greater than 30 minutes including physician and PA time.  Ashley Jones, P.A.C.   ______________________________ Ashley Jones, M.D.    DD/MEDQ  D:  08/02/2010  T:  08/02/2010  Job:  [id number]  cc:   Veverly Fells. Altheimer, M.D.  Electronically Signed by Ashley Jones M.D. on 08/08/2010 08:30:06 AM Electronically Signed by Ashley Jones  on 08/15/2010 09:25:18 AM

## 2010-08-13 ENCOUNTER — Telehealth: Payer: Self-pay | Admitting: *Deleted

## 2010-08-13 NOTE — Telephone Encounter (Signed)
Patient didn't show for her appointment that was scheduled. Darl Pikes tried to get in touch with patient last week and she was unable to do so.  Mailed a letter to contact office and left message at the number listed in chart

## 2010-08-17 ENCOUNTER — Emergency Department (HOSPITAL_COMMUNITY): Payer: Self-pay

## 2010-08-17 ENCOUNTER — Observation Stay (HOSPITAL_COMMUNITY)
Admission: EM | Admit: 2010-08-17 | Discharge: 2010-08-19 | Disposition: A | Discharge status: Home / Self Care | Payer: Self-pay | Attending: Internal Medicine | Admitting: Internal Medicine

## 2010-08-17 DIAGNOSIS — R0789 Other chest pain: Principal | ICD-10-CM | POA: Insufficient documentation

## 2010-08-17 DIAGNOSIS — I10 Essential (primary) hypertension: Secondary | ICD-10-CM | POA: Insufficient documentation

## 2010-08-17 DIAGNOSIS — F319 Bipolar disorder, unspecified: Secondary | ICD-10-CM | POA: Insufficient documentation

## 2010-08-17 DIAGNOSIS — F200 Paranoid schizophrenia: Secondary | ICD-10-CM | POA: Insufficient documentation

## 2010-08-17 DIAGNOSIS — E042 Nontoxic multinodular goiter: Secondary | ICD-10-CM | POA: Insufficient documentation

## 2010-08-17 DIAGNOSIS — D638 Anemia in other chronic diseases classified elsewhere: Secondary | ICD-10-CM | POA: Insufficient documentation

## 2010-08-17 DIAGNOSIS — R0602 Shortness of breath: Secondary | ICD-10-CM | POA: Insufficient documentation

## 2010-08-17 DIAGNOSIS — Z86718 Personal history of other venous thrombosis and embolism: Secondary | ICD-10-CM | POA: Insufficient documentation

## 2010-08-17 DIAGNOSIS — J329 Chronic sinusitis, unspecified: Secondary | ICD-10-CM | POA: Insufficient documentation

## 2010-08-17 LAB — CBC
HCT: 30.1 % — ABNORMAL LOW (ref 36.0–46.0)
Hemoglobin: 10.4 g/dL — ABNORMAL LOW (ref 12.0–15.0)
RBC: 3.31 MIL/uL — ABNORMAL LOW (ref 3.87–5.11)
RDW: 11.6 % (ref 11.5–15.5)
WBC: 10.2 10*3/uL (ref 4.0–10.5)

## 2010-08-17 LAB — URINALYSIS, ROUTINE W REFLEX MICROSCOPIC
Glucose, UA: NEGATIVE mg/dL
Hgb urine dipstick: NEGATIVE
Ketones, ur: NEGATIVE mg/dL
Protein, ur: NEGATIVE mg/dL
Urobilinogen, UA: 1 mg/dL (ref 0.0–1.0)

## 2010-08-17 LAB — CARDIAC PANEL(CRET KIN+CKTOT+MB+TROPI)
CK, MB: 2.4 ng/mL (ref 0.3–4.0)
CK, MB: 2.1 ng/mL (ref 0.3–4.0)
Relative Index: 1.7 (ref 0.0–2.5)
Total CK: 138 U/L (ref 7–177)
Total CK: 126 U/L (ref 7–177)
Troponin I: <0.3 ng/mL (ref <0.30)
Troponin I: <0.3 ng/mL (ref <0.30)

## 2010-08-17 LAB — DIFFERENTIAL
Basophils Absolute: 0 10*3/uL (ref 0.0–0.1)
Lymphocytes Relative: 15 % (ref 12–46)
Neutro Abs: 8.1 10*3/uL — ABNORMAL HIGH (ref 1.7–7.7)
Neutrophils Relative %: 79 % — ABNORMAL HIGH (ref 43–77)

## 2010-08-17 LAB — BASIC METABOLIC PANEL
CO2: 28 mEq/L (ref 19–32)
GFR calc non Af Amer: >60 mL/min (ref >60)
Glucose, Bld: 109 mg/dL — ABNORMAL HIGH (ref 70–99)
Potassium: 4 mEq/L (ref 3.5–5.1)
Sodium: 134 mEq/L — ABNORMAL LOW (ref 135–145)

## 2010-08-17 LAB — ETHANOL: Alcohol, Ethyl (B): <11 mg/dL (ref 0–11)

## 2010-08-17 LAB — PROTIME-INR: INR: 1.03 (ref 0.00–1.49)

## 2010-08-17 LAB — CK TOTAL AND CKMB (NOT AT ARMC)
CK, MB: 3.3 ng/mL (ref 0.3–4.0)
Relative Index: 1.8 (ref 0.0–2.5)
Total CK: 186 U/L — ABNORMAL HIGH (ref 7–177)

## 2010-08-17 LAB — RAPID URINE DRUG SCREEN, HOSP PERFORMED
Amphetamines: NOT DETECTED
Opiates: NOT DETECTED
Tetrahydrocannabinol: NOT DETECTED

## 2010-08-17 LAB — LIPID PANEL
LDL Cholesterol: 47 mg/dL (ref 0–99)
Triglycerides: 25 mg/dL (ref <150)
VLDL: 5 mg/dL (ref 0–40)

## 2010-08-17 MED ORDER — IOHEXOL 300 MG/ML  SOLN
100.0000 mL | Freq: Once | INTRAMUSCULAR | Status: AC | PRN
  Administered 2010-08-17: 100 mL via INTRAVENOUS

## 2010-08-18 LAB — CBC
HCT: 34.5 % — ABNORMAL LOW (ref 36.0–46.0)
Hemoglobin: 11.5 g/dL — ABNORMAL LOW (ref 12.0–15.0)
MCH: 30.6 pg (ref 26.0–34.0)
MCV: 91.8 fL (ref 78.0–100.0)
Platelets: 215 10*3/uL (ref 150–400)
RBC: 3.76 MIL/uL — ABNORMAL LOW (ref 3.87–5.11)
RDW: 11.9 % (ref 11.5–15.5)

## 2010-08-18 LAB — PROTIME-INR: INR: 1.21 (ref 0.00–1.49)

## 2010-08-19 ENCOUNTER — Inpatient Hospital Stay (HOSPITAL_COMMUNITY): Payer: Self-pay

## 2010-08-19 LAB — PROTIME-INR: Prothrombin Time: 16.3 seconds — ABNORMAL HIGH (ref 11.6–15.2)

## 2010-08-21 ENCOUNTER — Encounter: Payer: Self-pay | Admitting: *Deleted

## 2010-09-01 NOTE — H&P (Signed)
Ashley Jones, Ashley Jones NO.:  [id number]  MEDICAL RECORD NO.:  [id number]  LOCATION:  2022                         FACILITY:  [facility]  PHYSICIAN:  Lonia Blood, M.D.      DATE OF BIRTH:  Feb 02, 1976  DATE OF ADMISSION:  08/17/2010 DATE OF DISCHARGE:                             HISTORY & PHYSICAL   PRIMARY CARE PHYSICIAN:  She is unassigned to [place].  PRESENTING COMPLAINT:  Chest pain.  HISTORY OF PRESENT ILLNESS:  The patient is a 35 year old female who was admitted on Jul 29, 2010, with new-onset atrial fibrillation that simultaneously converted.  Down that period, She was also diagnosed with acute occlusive DVT of the right femoral and common femoral veins.  The patient was discharged home on Lovenox and Coumadin crossover, however, she did not obtain all of her medications as an outpatient.  She could not afford it and did not take any.  She came in today with continued chest pain that is persistent, centrally located and sharp.  She rates it as 7-8/10, associated with some shortness of breath.  No cough.  No fever.  She denied any pain in her legs, but she has generalized pain all over her right side.  The patient was being evaluated for possible recurrent PE.  There are also associated headaches.  PAST MEDICAL HISTORY:  Significant for atrial fibrillation which has now converted, DVT, multinodular goiter, bipolar disorder, hypertension, and paranoid schizophrenia.  ALLERGIES:  TETRACYCLINE that apparently causes some nausea.  MEDICATIONS:  The patient is not on any medicine at this point.  SOCIAL HISTORY:  She lives here in [place] with her cousin.  She is unemployed, divorces, with 4 children.  She used to smoke, but quit over 16 years ago.  Denied any alcohol or IV drug use.  FAMILY HISTORY:  Mother had a history of MI and hypertension, but is alive.  Father is alive and also healthy.  One sister, has some brothers, not aware of any medical  problems.  REVIEW OF SYSTEMS:  All systems reviewed are negative except for HPI.  PHYSICAL EXAMINATION:  VITAL SIGNS:  Temperature 99.3, blood pressure 147/94, her pulse is 72, respiratory rate of 18, and sats 99% on room air. GENERAL:  She is awake, alert, oriented, and looks withdrawn, but she is not in acute distress. HEENT:  PERRL.  EOMI.  No significant pallor.  No jaundice.  No rhinorrhea. NECK:  Supple.  No JVD.  No lymphadenopathy. RESPIRATORY:  Good air entry bilaterally.  No significant wheezing, rales, or crackles. CARDIOVASCULAR:  She has S1 and S2.  No audible murmur. ABDOMEN:  Soft, full, nontender with positive bowel sounds. EXTREMITIES:  No edema, cyanosis, or clubbing. SKIN:  No significant rashes or ulcers.  No bruises. MUSCULOSKELETAL:  No significant joint swelling or tenderness.  LABORATORY DATA:  White count is 10.2, hemoglobin 10.4 with MCV of 90, platelet count 252.  She has slight left shift ANC of 8.1.  Her PT is 13.7 and INR 1.03.  Alcohol level is less than 11.  Urine drug screen is negative.  Sodium is 134, potassium 4.0, chloride 100, CO2 of 28, glucose 109, BUN 6, creatinine 0.66, and calcium 9.1.  Initial cardiac enzymes:  CK 186 with an MB of 3.3 and troponin less than 0.03. Urinalysis is negative.  Chest x-ray shows no active cardiopulmonary disease.  Head CT without contrast showed no significant intracranial abnormalities, just sinusitis.  CT angiogram of the chest showed negative for acute or chronic pulmonary embolism.  There is mild basilar atelectasis.  Her EKG showed normal sinus rhythm with a rate of 68, no significant ST-T wave changes.  ASSESSMENT:  This is a 35 year old female with recent deep vein thrombosis presenting with chest pain.  It appears the patient does not have a new PE at this point.  Her chest pain is atypical.  However, the patient needs to be anticoagulated from her recent DVT and she has been off her Coumadin and  Lovenox for 2 weeks.  Her INR is also subtherapeutic.  PLAN:  Therefore, 1. Chest pain.  This is atypical.  We will cycle her enzymes, although     more than likely this is noncardiac.  She is not in AFib at this     time around even though previously she was in AFib when she came     in.  We will follow her closely with pain control and     anticoagulation. 2. Recent DVT.  Again, we will put her back on Lovenox and Coumadin.     We will get social workers involved to see if there is a way the     patient will be able to get her Coumadin as an outpatient. 3. Recent AFib:  She is currently in normal sinus rhythm.  We will     follow closely. 4. Anemia.  It looks like anemia of chronic disease.  Again, we will     follow her H and H closely. 5. History of bipolar disorder.  She is not on any medication at this     point.  She may require psychiatric involvement to put her back on     her previous medications. 6. Hypertension.  Her blood pressure seems all right.  The patient is     not on any medicine at this point.  She might benefit from some     beta-blockers. 7. Sinusitis.  She has low-grade fever and mild leukocytosis and theonly finding on her CT is possible sinusitis.  We will consider     empiric treatment if she is symptomatic, especially mild oral     antibiotics prior to discharge.     Lonia Blood, M.D.     Verlin Grills  D:  08/17/2010  T:  08/17/2010  Job:  [id number]  Electronically Signed by Lonia Blood M.D. on 09/01/2010 12:32:49 AM

## 2010-09-19 NOTE — Discharge Summary (Signed)
NAMEANTONIETTE, Jones NO.:  [id number]  MEDICAL RECORD NO.:  [id number]  LOCATION:  2022                         FACILITY:  [facility]  PHYSICIAN:  Calvert Cantor, M.D.     DATE OF BIRTH:  [birth year]  DATE OF ADMISSION:  08/17/2010 DATE OF DISCHARGE:  08/19/2010                              DISCHARGE SUMMARY   PRIMARY CARE PHYSICIAN:  None.  The patient is being referred to [facility].  CARDIOLOGIST:  [facility].  PRESENTING COMPLAINT: 1. Chest pain, atypical for MI, etiology uncertain. 2. DVT.  The patient was unable to afford Coumadin.  She is as     assuring [place] now that she will go ahead and buy this. 3. Recent episode of atrial fibrillation which was paroxysmal.  She is     back in normal sinus rhythm. 4. Anemia, appears to be anemia of chronic disease. 5. Past history of bipolar disorder.  The patient states she no longer     has this. 6. Sinusitis with fevers during hospital stay. 7. Multinodular goiter.  The patient was referred to endocrinologist     for this after her last admission.  She was euthyroid.  DISCHARGE MEDICATIONS: 1. Ciprofloxacin 500 mg b.i.d. to complete a 2-week course for     sinusitis. 2. Lovenox 90 mg b.i.d. 3. Coumadin 10 mg daily.  The patient has an appointment to follow up     with [facility] in 2 days where doses can be adjusted.  HOSPITAL COURSE:  This is a 35 year old female who came to the hospital with a complaint of chest pain located in the center of her chest and sharp associated with some shortness of breath.  The patient was admitted and did rule out for an MI.  Since the pain is atypical, we are not going to do any further workup.  1. The patient was recently diagnosed with a DVT and discharged on     Coumadin.  She states she is unable to afford it and has not been     taking it.  We placed her back on Lovenox and Coumadin.  She will     receive a 1-week supply of Lovenox from her hospital.  She  is     receiving a 3-day supply of Coumadin.  After that, she states she     can buy it.  It should be 4 dollars from [facility].  In addition,     she has been given an appointment with [facility].     She does have a followup appointment with [facility] as     well. 2. Recent atrial fibrillation.  She remained in normal sinus rhythm     during this hospital stay. 3. Fevers and sinusitis.  Temperature went up to 101.1.  At this     point, she was given one-time dose of IV Levaquin, after which     fevers have resolved.  She has been is afebrile for greater than 24     hours.  She did have symptoms of stuffy nose, tenderness in her     face, and headache.  These have improved as well.  She is advised  to finish a 2-week course of ciprofloxacin.  PHYSICAL EXAMINATION ON DISCHARGE:  LUNGS:  Clear bilaterally. HEART:  Regular rate and rhythm.  No murmurs. ABDOMEN:  Soft, nontender, nondistended.  Bowel sounds positive.  No organomegaly. EXTREMITIES:  No cyanosis or clubbing.  Right leg is swollen.  This is the leg with her DVT.  There is no pitting noted.  CONDITION ON DISCHARGE:  Stable.  PERTINENT IMAGING:  CT scan of the head on August 17, 2010, for headache revealed no significant intracranial abnormality.  She did have sinusitis with mucosal edema in the sinuses and a small air-fluid level in left maxillary sinus.  There was also noted a left frontal dural calcification measuring 8 x 15 mm which may represent dystrophic dural calcification.  Chest x-ray two-view on June 16 did not reveal any acute cardiopulmonary disease.  CT angio of the chest on Tuesday June 16 was negative for PE.  She had mildly bibasilar atelectasis.  Repeat chest x-ray two-view on June 18 was stable without any cardiopulmonary disease.  FOLLOWUP INSTRUCTIONS:  She has been given a followup appointment with [facility] and a followup appointment with the [facility].  Time on discharge was 50 minutes.     Calvert Cantor, M.D.     SR/MEDQ  D:  08/19/2010  T:  08/20/2010  Job:  [id number]  Electronically Signed by Calvert Cantor M.D. on 09/19/2010 12:14:46 PM

## 2010-11-22 LAB — URINALYSIS, ROUTINE W REFLEX MICROSCOPIC
Nitrite: NEGATIVE
Protein, ur: NEGATIVE
Specific Gravity, Urine: 1.029
Urobilinogen, UA: 1

## 2010-11-22 LAB — DIFFERENTIAL
Basophils Relative: 0
Eosinophils Absolute: 0
Eosinophils Relative: 0
Monocytes Absolute: 0.7
Monocytes Relative: 6
Neutrophils Relative %: 68

## 2010-11-22 LAB — CBC
HCT: 35.5 — ABNORMAL LOW
Hemoglobin: 11.9 — ABNORMAL LOW
MCHC: 33.7
MCV: 92.4
RBC: 3.84 — ABNORMAL LOW

## 2010-11-22 LAB — RAPID URINE DRUG SCREEN, HOSP PERFORMED: Tetrahydrocannabinol: NOT DETECTED

## 2010-11-22 LAB — BASIC METABOLIC PANEL
CO2: 26
Chloride: 104
Creatinine, Ser: 0.76
GFR calc Af Amer: >60
Potassium: 3.6

## 2010-11-22 LAB — URINE MICROSCOPIC-ADD ON

## 2010-11-22 LAB — PREGNANCY, URINE: Preg Test, Ur: NEGATIVE

## 2010-12-02 LAB — POCT I-STAT, CHEM 8
BUN: 13
Calcium, Ion: 1.22
Chloride: 101
Creatinine, Ser: 0.9
Glucose, Bld: 126 — ABNORMAL HIGH
TCO2: 24

## 2010-12-02 LAB — URINE MICROSCOPIC-ADD ON

## 2010-12-02 LAB — URINALYSIS, ROUTINE W REFLEX MICROSCOPIC
Glucose, UA: NEGATIVE
Protein, ur: 30 — AB
pH: 6

## 2010-12-18 LAB — BASIC METABOLIC PANEL
BUN: 5 — ABNORMAL LOW
CO2: 26
Calcium: 9.2
Chloride: 100
Creatinine, Ser: 0.78
Glucose, Bld: 97

## 2010-12-18 LAB — DIFFERENTIAL
Basophils Absolute: 0
Basophils Relative: 0
Eosinophils Absolute: 0
Eosinophils Relative: 0
Lymphocytes Relative: 14
Lymphs Abs: 1.5
Monocytes Absolute: 0.6
Monocytes Relative: 5
Neutro Abs: 8.8 — ABNORMAL HIGH
Neutrophils Relative %: 81 — ABNORMAL HIGH

## 2010-12-18 LAB — URINALYSIS, ROUTINE W REFLEX MICROSCOPIC
Ketones, ur: NEGATIVE
Nitrite: NEGATIVE
Protein, ur: NEGATIVE

## 2010-12-18 LAB — CBC
HCT: 34.2 — ABNORMAL LOW
Hemoglobin: 11.5 — ABNORMAL LOW
MCHC: 33.6
MCV: 90.3
Platelets: 285
RBC: 3.79 — ABNORMAL LOW
RDW: 14.1 — ABNORMAL HIGH
WBC: 10.9 — ABNORMAL HIGH

## 2010-12-18 LAB — RAPID URINE DRUG SCREEN, HOSP PERFORMED
Amphetamines: NOT DETECTED
Barbiturates: NOT DETECTED
Opiates: NOT DETECTED
Tetrahydrocannabinol: NOT DETECTED

## 2010-12-18 LAB — URINE MICROSCOPIC-ADD ON

## 2010-12-18 LAB — VALPROIC ACID LEVEL: Valproic Acid Lvl: 67.5

## 2010-12-18 LAB — ETHANOL: Alcohol, Ethyl (B): <5

## 2010-12-18 LAB — PREGNANCY, URINE: Preg Test, Ur: NEGATIVE

## 2010-12-19 LAB — CBC
HCT: 35.7 — ABNORMAL LOW
Hemoglobin: 11.7 — ABNORMAL LOW
MCHC: 32.8
MCHC: 32.9
MCV: 91.4
MCV: 91.2
RBC: 4.19
RDW: 13.9
RDW: 14.4 — ABNORMAL HIGH

## 2010-12-19 LAB — COMPREHENSIVE METABOLIC PANEL
AST: 11
CO2: 27
Calcium: 9.7
Creatinine, Ser: 0.93
GFR calc Af Amer: >60
GFR calc non Af Amer: >60

## 2010-12-19 LAB — VALPROIC ACID LEVEL: Valproic Acid Lvl: 81.9

## 2010-12-19 LAB — BASIC METABOLIC PANEL
CO2: 22
Glucose, Bld: 156 — ABNORMAL HIGH
Potassium: 3.8
Sodium: 140

## 2010-12-19 LAB — RAPID URINE DRUG SCREEN, HOSP PERFORMED
Cocaine: NOT DETECTED
Opiates: NOT DETECTED
Tetrahydrocannabinol: NOT DETECTED

## 2012-05-11 ENCOUNTER — Emergency Department (HOSPITAL_COMMUNITY)
Admission: EM | Admit: 2012-05-11 | Discharge: 2012-05-11 | Disposition: A | Discharge status: Home / Self Care | Payer: Self-pay | Attending: Emergency Medicine | Admitting: Emergency Medicine

## 2012-05-11 ENCOUNTER — Emergency Department (HOSPITAL_COMMUNITY): Payer: Self-pay

## 2012-05-11 ENCOUNTER — Encounter (HOSPITAL_COMMUNITY): Payer: Self-pay | Admitting: *Deleted

## 2012-05-11 DIAGNOSIS — Z7901 Long term (current) use of anticoagulants: Secondary | ICD-10-CM | POA: Insufficient documentation

## 2012-05-11 DIAGNOSIS — R55 Syncope and collapse: Secondary | ICD-10-CM | POA: Insufficient documentation

## 2012-05-11 DIAGNOSIS — Z7982 Long term (current) use of aspirin: Secondary | ICD-10-CM | POA: Insufficient documentation

## 2012-05-11 DIAGNOSIS — E039 Hypothyroidism, unspecified: Secondary | ICD-10-CM | POA: Diagnosis present

## 2012-05-11 DIAGNOSIS — I10 Essential (primary) hypertension: Secondary | ICD-10-CM | POA: Insufficient documentation

## 2012-05-11 DIAGNOSIS — M7989 Other specified soft tissue disorders: Secondary | ICD-10-CM

## 2012-05-11 DIAGNOSIS — R002 Palpitations: Secondary | ICD-10-CM | POA: Insufficient documentation

## 2012-05-11 DIAGNOSIS — Z862 Personal history of diseases of the blood and blood-forming organs and certain disorders involving the immune mechanism: Secondary | ICD-10-CM | POA: Insufficient documentation

## 2012-05-11 DIAGNOSIS — Z87891 Personal history of nicotine dependence: Secondary | ICD-10-CM | POA: Insufficient documentation

## 2012-05-11 DIAGNOSIS — I4891 Unspecified atrial fibrillation: Secondary | ICD-10-CM | POA: Diagnosis present

## 2012-05-11 DIAGNOSIS — R42 Dizziness and giddiness: Secondary | ICD-10-CM | POA: Insufficient documentation

## 2012-05-11 DIAGNOSIS — Z8659 Personal history of other mental and behavioral disorders: Secondary | ICD-10-CM | POA: Insufficient documentation

## 2012-05-11 DIAGNOSIS — Z79899 Other long term (current) drug therapy: Secondary | ICD-10-CM | POA: Insufficient documentation

## 2012-05-11 DIAGNOSIS — Z8639 Personal history of other endocrine, nutritional and metabolic disease: Secondary | ICD-10-CM | POA: Insufficient documentation

## 2012-05-11 LAB — CBC WITH DIFFERENTIAL/PLATELET
Basophils Absolute: 0 10*3/uL (ref 0.0–0.1)
Eosinophils Absolute: 0 10*3/uL (ref 0.0–0.7)
Eosinophils Relative: 0 % (ref 0–5)
Lymphocytes Relative: 25 % (ref 12–46)
MCV: 92.7 fL (ref 78.0–100.0)
Platelets: 271 10*3/uL (ref 150–400)
RDW: 12.2 % (ref 11.5–15.5)
WBC: 8.5 10*3/uL (ref 4.0–10.5)

## 2012-05-11 LAB — URINALYSIS, ROUTINE W REFLEX MICROSCOPIC
Bilirubin Urine: NEGATIVE
Glucose, UA: NEGATIVE mg/dL
Hgb urine dipstick: NEGATIVE
Specific Gravity, Urine: 1.015 (ref 1.005–1.030)
pH: 7.5 (ref 5.0–8.0)

## 2012-05-11 LAB — APTT: aPTT: 38 seconds — ABNORMAL HIGH (ref 24–37)

## 2012-05-11 LAB — COMPREHENSIVE METABOLIC PANEL
ALT: 14 U/L (ref 0–35)
AST: 20 U/L (ref 0–37)
CO2: 24 mEq/L (ref 19–32)
Calcium: 9.4 mg/dL (ref 8.4–10.5)
Sodium: 137 mEq/L (ref 135–145)
Total Protein: 7.7 g/dL (ref 6.0–8.3)

## 2012-05-11 LAB — TSH: TSH: 0.233 u[IU]/mL — ABNORMAL LOW (ref 0.350–4.500)

## 2012-05-11 LAB — PRO B NATRIURETIC PEPTIDE: Pro B Natriuretic peptide (BNP): 324.7 pg/mL — ABNORMAL HIGH (ref 0–125)

## 2012-05-11 LAB — PROTIME-INR: INR: 0.97 (ref 0.00–1.49)

## 2012-05-11 MED ORDER — DILTIAZEM HCL 25 MG/5ML IV SOLN: 25.0000 mg | Freq: Once | INTRAVENOUS | Status: DC

## 2012-05-11 MED ORDER — FUROSEMIDE 10 MG/ML IJ SOLN
40.0000 mg | Freq: Once | INTRAMUSCULAR | Status: AC
  Administered 2012-05-11: 40 mg via INTRAVENOUS
  Filled 2012-05-11: qty 4

## 2012-05-11 MED ORDER — DILTIAZEM HCL 25 MG/5ML IV SOLN
20.0000 mg | Freq: Once | INTRAVENOUS | Status: AC
Start: 2012-05-11 — End: 2012-05-11
  Administered 2012-05-11: 20 mg via INTRAVENOUS
  Filled 2012-05-11: qty 5

## 2012-05-11 MED ORDER — DILTIAZEM HCL 100 MG IV SOLR: 10.0000 mg/h | INTRAVENOUS | Status: DC

## 2012-05-11 MED ORDER — DILTIAZEM HCL 100 MG IV SOLR
10.0000 mg/h | INTRAVENOUS | Status: DC
  Administered 2012-05-11: 25 mg/h via INTRAVENOUS
  Administered 2012-05-11 (×2): 10 mg/h via INTRAVENOUS

## 2012-05-11 MED ORDER — SODIUM CHLORIDE 0.9 % IV SOLN
INTRAVENOUS | Status: DC
  Administered 2012-05-11: 08:00:00 via INTRAVENOUS

## 2012-05-11 MED ORDER — POTASSIUM CHLORIDE CRYS ER 20 MEQ PO TBCR
40.0000 meq | EXTENDED_RELEASE_TABLET | Freq: Once | ORAL | Status: AC
  Administered 2012-05-11: 40 meq via ORAL
  Filled 2012-05-11: qty 2

## 2012-05-11 NOTE — Progress Notes (Signed)
VASCULAR LAB PRELIMINARY  PRELIMINARY  PRELIMINARY  PRELIMINARY  Right lower extremity venous duplex completed.    Preliminary report:  Right:  No evidence of DVT, superficial thrombosis, or Baker's cyst.  CESTONE, HELENE, RVT 05/11/2012, 9:10 AM

## 2012-05-11 NOTE — ED Notes (Signed)
The pt is alert she reports that she was told that she was going home.  cradizem drip empty  Removed from the pump.  Wash cloths and towels given so she could was h up.  She had urine spilled in the bed.  Waiting for d/c  Instructions.

## 2012-05-11 NOTE — ED Provider Notes (Addendum)
History     CSN: [id number]  Arrival date & time 05/11/12  3244   First MD Initiated Contact with Patient 05/11/12 0759      Chief Complaint  Patient presents with  . Near Syncope    (Consider location/radiation/quality/duration/timing/severity/associated sxs/prior treatment) HPI Comments: Patient is a 37 year old woman who says that this morning she became dizzy and passed out. Her heart beating very fast. She was all right yesterday. She says that she prior episode the problem and that she is also had blood clots, treated with warfarin about a year ago.  Patient is a 37 y.o. female presenting with syncope. The history is provided by the patient.  Loss of Consciousness  This is a new problem. The current episode started less than 1 hour ago. Episode frequency: One episode of syncope and rapid heartbeat. Progression since onset: She still feels rapid heartbeat, though she is awake and alert at present. Length of episode of loss of consciousness: Uncertain duration of loss of consciousness, thought to be somewhere in the area 5 minutes. Associated with: Rapid heart beat. Associated symptoms include dizziness, light-headedness and palpitations. Pertinent negatives include chest pain, diaphoresis and fever. Treatments tried: Patient was brought to [facility] by [facility].    Past Medical History  Diagnosis Date  . Hypertension   . Atrial fibrillation   . Lightheadedness   . Chest pain   . SOB (shortness of breath)   . Diaphoresis   . Nauseated   . Bipolar 1 disorder   . History of paranoid schizophrenia   . Multinodular goiter     BY THYROID ULTRASOUND 07/30/2010    Past Surgical History  Procedure Laterality Date  . Transthoracic echocardiogram  07/30/2010    Left ventricle: The cavity size was normal. Wall thickness was increased in a pattern of mild LVH. Systolic function was normal.  The estimated ejection fraction was in the range of 60% to 65%.    Family  History  Problem Relation Age of Onset  . Heart attack Mother   . Hypertension Mother     History  Substance Use Topics  . Smoking status: Former Smoker    Quit date: 08/05/1994  . Smokeless tobacco: Not on file  . Alcohol Use: No    OB History   Grav Para Term Preterm Abortions TAB SAB Ect Mult Living                  Review of Systems  Constitutional: Negative for fever, chills and diaphoresis.  HENT: Negative.   Eyes: Negative.   Respiratory: Negative.   Cardiovascular: Positive for palpitations, leg swelling (she has chronic swelling of her right leg.) and syncope. Negative for chest pain.  Gastrointestinal: Negative.   Endocrine:       She has a history of thyroid trouble.  Genitourinary: Negative.   Musculoskeletal: Negative.   Neurological: Positive for dizziness and light-headedness.  Psychiatric/Behavioral: Negative.     Allergies  Review of patient's allergies indicates no known allergies.  Home Medications   Current Outpatient Rx  Name  Route  Sig  Dispense  Refill  . acetaminophen (TYLENOL) 500 MG tablet   Oral   Take 500 mg by mouth every 6 (six) hours as needed.           Marland Kitchen aspirin 81 MG tablet   Oral   Take 81 mg by mouth daily.           Marland Kitchen diltiazem (CARDIZEM CD) 180  MG 24 hr capsule   Oral   Take 180 mg by mouth daily.           Marland Kitchen enoxaparin (LOVENOX) 80 MG/0.8ML SOLN   Subcutaneous   Inject into the skin every 12 (twelve) hours.           . potassium chloride SA (K-DUR,KLOR-CON) 20 MEQ tablet   Oral   Take 20 mEq by mouth 2 (two) times daily.           Marland Kitchen warfarin (COUMADIN) 5 MG tablet   Oral   Take 5 mg by mouth as directed.             BP 126/104  Pulse 173  Temp(Src) 97.5 F (36.4 C) (Oral)  Resp 20  SpO2 100%  Physical Exam  Nursing note and vitals reviewed. Constitutional: She is oriented to person, place, and time. She appears well-developed and well-nourished. No distress.   P 173, irregular.  HENT:   Head: Normocephalic and atraumatic.  Right Ear: External ear normal.  Left Ear: External ear normal.  Mouth/Throat: Oropharynx is clear and moist.  Eyes: Conjunctivae and EOM are normal. Pupils are equal, round, and reactive to light.  Neck: Normal range of motion. Neck supple. No thyromegaly present.  Cardiovascular:  Rapid and irregular rhythm.  Pulmonary/Chest: Effort normal and breath sounds normal.  Abdominal: Soft. Bowel sounds are normal.  Musculoskeletal:  Right leg has 2+ edema.  No calf tenderness.  Neurological: She is alert and oriented to person, place, and time.  No sensory or motor deficit.  Skin: Skin is warm and dry.  Psychiatric: She has a normal mood and affect. Her behavior is normal.    ED Course  Procedures (including critical care time)  Labs Reviewed  CBC WITH DIFFERENTIAL  COMPREHENSIVE METABOLIC PANEL  URINALYSIS, ROUTINE W REFLEX MICROSCOPIC  PROTIME-INR  APTT  D-DIMER, QUANTITATIVE  PRO B NATRIURETIC PEPTIDE  TSH   8:25 AM  Date: 05/11/2012  Rate: 171  Rhythm: atrial fibrillation and premature ventricular contractions (PVC) with rapid rate  QRS Axis: normal  Intervals: QT prolonged  ST/T Wave abnormalities: normal  Conduction Disutrbances:none  Narrative Interpretation: Abnormal EKG  Old EKG Reviewed: changes noted--was in sinus rhythm on 08/17/2010.  Patient had rapid atrial fibrillation on EKG. She was placed on a Cardizem bolus and drip.  9:17 AM Rate still around 160.  Will rebolus with 25 mg Cardizem.  Her venous doppler was negative.  10:08 AM Results for orders placed during the hospital encounter of 05/11/12  CBC WITH DIFFERENTIAL      Result Value Range   WBC 8.5  4.0 - 10.5 K/uL   RBC 4.38  3.87 - 5.11 MIL/uL   Hemoglobin 14.1  12.0 - 15.0 g/dL   HCT 86.5  78.4 - 69.6 %   MCV 92.7  78.0 - 100.0 fL   MCH 32.2  26.0 - 34.0 pg   MCHC 34.7  30.0 - 36.0 g/dL   RDW 29.5  28.4 - 13.2 %   Platelets 271  150 - 400 K/uL    Neutrophils Relative 67  43 - 77 %   Neutro Abs 5.7  1.7 - 7.7 K/uL   Lymphocytes Relative 25  12 - 46 %   Lymphs Abs 2.2  0.7 - 4.0 K/uL   Monocytes Relative 7  3 - 12 %   Monocytes Absolute 0.6  0.1 - 1.0 K/uL   Eosinophils Relative 0  0 - 5 %  Eosinophils Absolute 0.0  0.0 - 0.7 K/uL   Basophils Relative 0  0 - 1 %   Basophils Absolute 0.0  0.0 - 0.1 K/uL  COMPREHENSIVE METABOLIC PANEL      Result Value Range   Sodium 137  135 - 145 mEq/L   Potassium 3.6  3.5 - 5.1 mEq/L   Chloride 102  96 - 112 mEq/L   CO2 24  19 - 32 mEq/L   Glucose, Bld 102 (*) 70 - 99 mg/dL   BUN 8  6 - 23 mg/dL   Creatinine, Ser 1.61  0.50 - 1.10 mg/dL   Calcium 9.4  8.4 - 09.6 mg/dL   Total Protein 7.7  6.0 - 8.3 g/dL   Albumin 3.8  3.5 - 5.2 g/dL   AST 20  0 - 37 U/L   ALT 14  0 - 35 U/L   Alkaline Phosphatase 57  39 - 117 U/L   Total Bilirubin 0.4  0.3 - 1.2 mg/dL   GFR calc non Af Amer >90  >90 mL/min   GFR calc Af Amer >90  >90 mL/min  PROTIME-INR      Result Value Range   Prothrombin Time 12.8  11.6 - 15.2 seconds   INR 0.97  0.00 - 1.49  APTT      Result Value Range   aPTT 38 (*) 24 - 37 seconds  D-DIMER, QUANTITATIVE      Result Value Range   D-Dimer, Quant 0.32  0.00 - 0.48 ug/mL-FEU  PRO B NATRIURETIC PEPTIDE      Result Value Range   Pro B Natriuretic peptide (BNP) 324.7 (*) 0 - 125 pg/mL  POCT I-STAT TROPONIN I      Result Value Range   Troponin i, poc 0.00  0.00 - 0.08 ng/mL   Comment 3            Dg Chest 2 View  05/11/2012  *RADIOLOGY REPORT*  Clinical Data: Syncope.  CHEST - 2 VIEW  Comparison: August 19, 2010.  Findings: Cardiomediastinal silhouette appears normal.  No acute pulmonary disease is noted.  Bony thorax is intact.  IMPRESSION: No acute cardiopulmonary abnormality seen.   Original Report Authenticated By: Lupita Raider.,  M.D.     Rate running 100 to 130 now.  She continues on Cardizem 10 mg per hour.  Will request admission for rapid atrial fibrillation.  10:14  AM Case discussed with Daun Peacock; [facility] will see and admit pt.  1. Syncope   2. Atrial fibrillation with rapid ventricular response     4:24 PM Pt had consultation with [facility], had converted to sinus rhythm, and is to be discharged to followup in their office.   Carleene Cooper III, MD 05/11/12 1015     Carleene Cooper III, MD 05/11/12 [phone]

## 2012-05-11 NOTE — Consult Note (Signed)
Referring Physician: Dr Ignacia Palma, ER Primary Physician: [facility] [address] (last visit > 5 years ago) Primary Cardiologist: TB - admitted in May 2012 Reason for Consultation: afib, RVR  HPI: Ashley Jones is a 37 year old female with a history of paroxysmal atrial fibrillation. She was in her usual state of health today when she had sudden onset of a hot feeling over her whole body. She also had a feeling her heart going "weird". She became dizzy and then had near-syncope. She had a decrease in her vision and became very weak and could not stand. A friend was with her and kept her from falling. She denies losing consciousness completely. EMS was summoned and she was brought to the emergency room. She was in atrial fibrillation with rapid ventricular response. Her heart rate improved on IV Cardizem but was still tachycardic. She denied chest pain or shortness of breath. She did not feel the palpitations except when her heart rate was very high. The dizziness has almost completely resolved but was still there when she moved around. The heavy feeling had greatly improved and she was feeling much better than she did prior to admission. She has not had any palpitations or presyncope since her admission in 2012 with PAF and a DVT. She admits that she did not take Coumadin after leaving the hospital in June of 2012. During the interview, the patient spontaneously converted to sinus rhythm. She stated she did not feel any different after she converted but was definitely feeling better than on admission.  Review of Systems:     Cardiac Review of Systems: {Y] = yes [ ]  = no  Chest Pain [    ]  Resting SOB [   ] Exertional SOB  [  ]  Orthopnea [  ]   Pedal Edema [   ]    Palpitations [ y ] Syncope  [  ]   Presyncope [ y  ]  General Review of Systems: [Y] = yes [  ]=no Constitional: recent weight change [  ]; anorexia [  ]; fatigue [  ]; nausea [  ]; night sweats [  ]; fever [  ]; or chills [  ];                                                                                                                                           Dental: poor dentition[  ];   Eye : blurred vision [  ]; diplopia [   ]; vision changes [  ];  Amaurosis fugax[  ]; Resp: cough [  ];  wheezing[  ];  hemoptysis[  ]; shortness of breath[  ]; paroxysmal nocturnal dyspnea[  ]; dyspnea on exertion[  ]; or orthopnea[  ];  GI:  gallstones[  ], vomiting[  ];  dysphagia[  ]; melena[  ];  hematochezia [  ]; heartburn[  ];  Hx of  Colonoscopy[  ]; GU: kidney stones [  ]; hematuria[  ];   dysuria [  ];  nocturia[  ];  history of     obstruction [  ];                 Skin: rash, swelling[  ];, hair loss[  ];  peripheral edema[  ];  or itching[  ]; Musculosketetal: myalgias[  ];  joint swelling[  ];  joint erythema[  ];  joint pain[  ];  back pain[  ];  Heme/Lymph: bruising[  ];  bleeding[  ];  anemia[  ];  Neuro: TIA[  ];  headaches[  ];  stroke[  ];  vertigo[  ];  seizures[  ];   paresthesias[  ];  difficulty walking[  ];  Psych:depression[  ]; anxiety[  ];  Endocrine: diabetes[  ];  thyroid dysfunction[  ];  Immunizations: Flu [  ]; Pneumococcal[  ];  Other:  Past Medical History  Diagnosis Date  . Hypertension   . Atrial fibrillation   . Lightheadedness   . Chest pain   . SOB (shortness of breath)   . Diaphoresis   . Nauseated   . Bipolar 1 disorder   . History of paranoid schizophrenia   . Multinodular goiter     BY THYROID ULTRASOUND 07/30/2010   Past Surgical History  Procedure Laterality Date  . Transthoracic echocardiogram  07/30/2010    Left ventricle: The cavity size was normal. Wall thickness was increased in a pattern of mild LVH. Systolic function was normal.  The estimated ejection fraction was in the range of 60% to 65%.      Infusions: . sodium chloride 125 mL/hr at 05/11/12 0813  . diltiazem (CARDIZEM) infusion 10 mg/hr (05/11/12 0948)    No Known Allergies  History   Social History  .  Marital Status: Legally Separated    Spouse Name: N/A    Number of Children: N/A  . Years of Education: N/A   Occupational History  . Unemployed   Social History Main Topics  . Smoking status: Former Smoker    Quit date: 08/05/1994  . Smokeless tobacco: Not on file  . Alcohol Use: No  . Drug Use: No  . Sexually Active:    Other Topics Concern  . Not on file   Social History Narrative  . No narrative on file    Family History  Problem Relation Age of Onset  . Heart attack Mother   . Hypertension Mother     PHYSICAL EXAM: Filed Vitals:   05/11/12 1115  BP: 115/72  Pulse: 161  Temp:   Resp: 16    No intake or output data in the 24 hours ending 05/11/12 1132  General:  Well appearing. No respiratory difficulty HEENT: normal Neck: supple. JVP 6-7 with prominent CV waves. Carotids 2+ bilat; no bruits. No lymphadenopathy or thryomegaly appreciated. Cor: PMI nondisplaced. Regular rate & rhythm. No rubs, gallops or murmurs. Lungs: clear bilaterally Abdomen: soft, nontender, nondistended. No hepatosplenomegaly. No bruits or masses. Good bowel sounds. Extremities: no cyanosis, clubbing, rash; asymmetric edema R>L. 2+ pulses in all 4 extremities Neuro: alert & oriented x 3, cranial nerves grossly intact. moves all 4 extremities w/o difficulty. Affect pleasant.  ECG: AF 171. No ST-T wave abnormalities.     Results for orders placed during the hospital encounter of 05/11/12 (from the past 24 hour(s))  CBC WITH DIFFERENTIAL     Status: None  Collection Time    05/11/12  8:01 AM      Result Value Range   WBC 8.5  4.0 - 10.5 K/uL   RBC 4.38  3.87 - 5.11 MIL/uL   Hemoglobin 14.1  12.0 - 15.0 g/dL   HCT 16.1  09.6 - 04.5 %   MCV 92.7  78.0 - 100.0 fL   MCH 32.2  26.0 - 34.0 pg   MCHC 34.7  30.0 - 36.0 g/dL   RDW 40.9  81.1 - 91.4 %   Platelets 271  150 - 400 K/uL   Neutrophils Relative 67  43 - 77 %   Neutro Abs 5.7  1.7 - 7.7 K/uL   Lymphocytes Relative 25  12 - 46  %   Lymphs Abs 2.2  0.7 - 4.0 K/uL   Monocytes Relative 7  3 - 12 %   Monocytes Absolute 0.6  0.1 - 1.0 K/uL   Eosinophils Relative 0  0 - 5 %   Eosinophils Absolute 0.0  0.0 - 0.7 K/uL   Basophils Relative 0  0 - 1 %   Basophils Absolute 0.0  0.0 - 0.1 K/uL  COMPREHENSIVE METABOLIC PANEL     Status: Abnormal   Collection Time    05/11/12  8:01 AM      Result Value Range   Sodium 137  135 - 145 mEq/L   Potassium 3.6  3.5 - 5.1 mEq/L   Chloride 102  96 - 112 mEq/L   CO2 24  19 - 32 mEq/L   Glucose, Bld 102 (*) 70 - 99 mg/dL   BUN 8  6 - 23 mg/dL   Creatinine, Ser 7.82  0.50 - 1.10 mg/dL   Calcium 9.4  8.4 - 95.6 mg/dL   Total Protein 7.7  6.0 - 8.3 g/dL   Albumin 3.8  3.5 - 5.2 g/dL   AST 20  0 - 37 U/L   ALT 14  0 - 35 U/L   Alkaline Phosphatase 57  39 - 117 U/L   Total Bilirubin 0.4  0.3 - 1.2 mg/dL   GFR calc non Af Amer >90  >90 mL/min   GFR calc Af Amer >90  >90 mL/min  PROTIME-INR     Status: None   Collection Time    05/11/12  8:01 AM      Result Value Range   Prothrombin Time 12.8  11.6 - 15.2 seconds   INR 0.97  0.00 - 1.49  APTT     Status: Abnormal   Collection Time    05/11/12  8:01 AM      Result Value Range   aPTT 38 (*) 24 - 37 seconds  D-DIMER, QUANTITATIVE     Status: None   Collection Time    05/11/12  8:01 AM      Result Value Range   D-Dimer, Quant 0.32  0.00 - 0.48 ug/mL-FEU  PRO B NATRIURETIC PEPTIDE     Status: Abnormal   Collection Time    05/11/12  8:01 AM      Result Value Range   Pro B Natriuretic peptide (BNP) 324.7 (*) 0 - 125 pg/mL  POCT I-STAT TROPONIN I     Status: None   Collection Time    05/11/12  8:33 AM      Result Value Range   Troponin i, poc 0.00  0.00 - 0.08 ng/mL   Comment 3            Dg Chest  2 View  05/11/2012  *RADIOLOGY REPORT*  Clinical Data: Syncope.  CHEST - 2 VIEW  Comparison: August 19, 2010.  Findings: Cardiomediastinal silhouette appears normal.  No acute pulmonary disease is noted.  Bony thorax is intact.   IMPRESSION: No acute cardiopulmonary abnormality seen.   Original Report Authenticated By: Lupita Raider.,  M.D.      ASSESSMENT: 1. Paroxysmal atrial fibrillation with rapid ventricular response - resolved spontaneously    -CHADS2 = 1 (HTN) 2. near syncope. 3. H/o hyperthyroid 4. H/o noncompliance 5. Bipolar d/o 6. HTN  PLAN/DISCUSSION:  Ms. Divelbiss presents with recurrent AF with RVR. She had a previous episode in 2012 at which time her TSH was low but she did not f/u. She has been doing relatively well as an outpatient. Had several hour episode of AF and now converted spontaneously. On exam does have mild volume overload with asymmetric LE edema R>L. (U/s negative for DVT). Will check echo and TSH. If no major findings can d/c home with outpatient f/u. With CHADS 1 would use ASA 325 vs NOAC. She is not interested in [facility] so will use ASA. Will give 1 dose IV lasix and potassium.  Ashley Boom Bensimhon,MD 12:53 PM

## 2012-05-11 NOTE — ED Notes (Signed)
MD at bedside. 

## 2012-05-11 NOTE — Progress Notes (Signed)
  Echocardiogram 2D Echocardiogram has been performed.  Ellender Hose A 05/11/2012, 3:26 PM

## 2012-05-11 NOTE — ED Notes (Signed)
PT states this was sitting down, got dizzy, and then passed out.  Did not hit head.  Pt was caught by another person.  Pt arrived with irregular heart rate in the 160/170s on monitor

## 2012-05-19 ENCOUNTER — Encounter: Payer: Self-pay | Admitting: Physician Assistant

## 2012-11-19 IMAGING — CR DG CHEST 1V PORT
1 series · 1 of 1 positions shown · non-contrast
Comparison: 09/05/2006

CLINICAL DATA: Shortness of breath

PORTABLE CHEST - 1 VIEW

[AP]
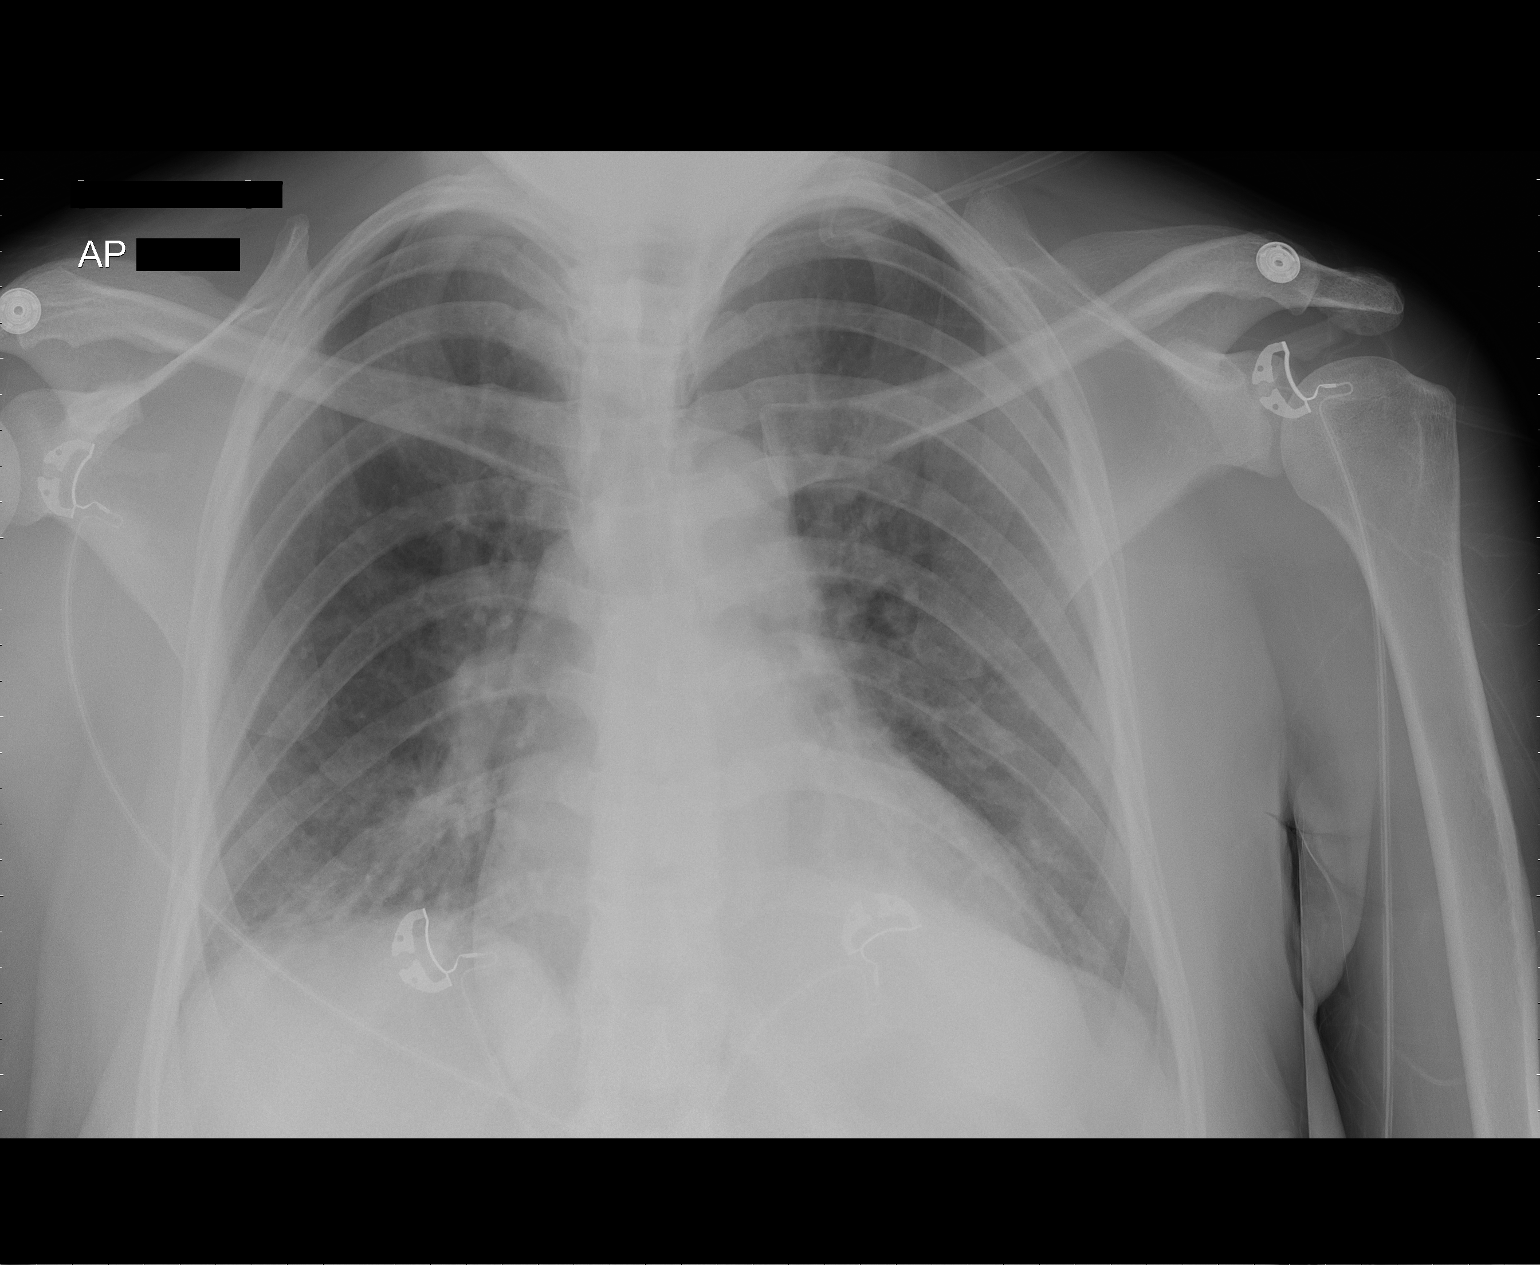

[1 of 1 positions shown; findings below may reference images not displayed]

FINDINGS: Right basilar opacity, likely atelectasis.  No
pneumothorax.

Cardiomegaly.
IMPRESSION: Right basilar opacity, likely atelectasis.

Cardiomegaly.

## 2012-12-20 ENCOUNTER — Encounter (HOSPITAL_COMMUNITY): Payer: Self-pay | Admitting: Emergency Medicine

## 2012-12-20 ENCOUNTER — Inpatient Hospital Stay (HOSPITAL_COMMUNITY)
Admission: EM | Admit: 2012-12-20 | Discharge: 2012-12-25 | DRG: 308 | Disposition: A | Discharge status: Home / Self Care | Payer: Medicaid Other | Attending: Internal Medicine | Admitting: Internal Medicine

## 2012-12-20 ENCOUNTER — Emergency Department (HOSPITAL_COMMUNITY): Payer: Medicaid Other

## 2012-12-20 DIAGNOSIS — J90 Pleural effusion, not elsewhere classified: Secondary | ICD-10-CM

## 2012-12-20 DIAGNOSIS — T502X5A Adverse effect of carbonic-anhydrase inhibitors, benzothiadiazides and other diuretics, initial encounter: Secondary | ICD-10-CM | POA: Diagnosis not present

## 2012-12-20 DIAGNOSIS — R0602 Shortness of breath: Secondary | ICD-10-CM

## 2012-12-20 DIAGNOSIS — E876 Hypokalemia: Secondary | ICD-10-CM | POA: Diagnosis not present

## 2012-12-20 DIAGNOSIS — Z87891 Personal history of nicotine dependence: Secondary | ICD-10-CM

## 2012-12-20 DIAGNOSIS — G4733 Obstructive sleep apnea (adult) (pediatric): Secondary | ICD-10-CM | POA: Diagnosis present

## 2012-12-20 DIAGNOSIS — R609 Edema, unspecified: Secondary | ICD-10-CM | POA: Diagnosis present

## 2012-12-20 DIAGNOSIS — E039 Hypothyroidism, unspecified: Secondary | ICD-10-CM | POA: Diagnosis present

## 2012-12-20 DIAGNOSIS — I509 Heart failure, unspecified: Secondary | ICD-10-CM | POA: Diagnosis present

## 2012-12-20 DIAGNOSIS — J9600 Acute respiratory failure, unspecified whether with hypoxia or hypercapnia: Secondary | ICD-10-CM | POA: Diagnosis not present

## 2012-12-20 DIAGNOSIS — I959 Hypotension, unspecified: Secondary | ICD-10-CM | POA: Diagnosis present

## 2012-12-20 DIAGNOSIS — I4891 Unspecified atrial fibrillation: Principal | ICD-10-CM | POA: Diagnosis present

## 2012-12-20 DIAGNOSIS — N61 Mastitis without abscess: Secondary | ICD-10-CM | POA: Diagnosis present

## 2012-12-20 DIAGNOSIS — I10 Essential (primary) hypertension: Secondary | ICD-10-CM | POA: Diagnosis present

## 2012-12-20 DIAGNOSIS — Z86718 Personal history of other venous thrombosis and embolism: Secondary | ICD-10-CM

## 2012-12-20 DIAGNOSIS — M7989 Other specified soft tissue disorders: Secondary | ICD-10-CM | POA: Diagnosis present

## 2012-12-20 DIAGNOSIS — E662 Morbid (severe) obesity with alveolar hypoventilation: Secondary | ICD-10-CM | POA: Diagnosis present

## 2012-12-20 DIAGNOSIS — N179 Acute kidney failure, unspecified: Secondary | ICD-10-CM | POA: Diagnosis present

## 2012-12-20 DIAGNOSIS — D649 Anemia, unspecified: Secondary | ICD-10-CM | POA: Diagnosis present

## 2012-12-20 DIAGNOSIS — I5021 Acute systolic (congestive) heart failure: Secondary | ICD-10-CM | POA: Diagnosis present

## 2012-12-20 DIAGNOSIS — R55 Syncope and collapse: Secondary | ICD-10-CM | POA: Diagnosis present

## 2012-12-20 LAB — POCT I-STAT TROPONIN I: Troponin i, poc: 0 ng/mL (ref 0.00–0.08)

## 2012-12-20 LAB — CBC
Hemoglobin: 10.9 g/dL — ABNORMAL LOW (ref 12.0–15.0)
MCH: 31.2 pg (ref 26.0–34.0)
MCV: 92.6 fL (ref 78.0–100.0)
Platelets: 243 10*3/uL (ref 150–400)
RBC: 3.49 MIL/uL — ABNORMAL LOW (ref 3.87–5.11)
RDW: 14 % (ref 11.5–15.5)
WBC: 7.6 10*3/uL (ref 4.0–10.5)

## 2012-12-20 LAB — BASIC METABOLIC PANEL
Calcium: 9.5 mg/dL (ref 8.4–10.5)
Chloride: 101 mEq/L (ref 96–112)
Creatinine, Ser: 0.82 mg/dL (ref 0.50–1.10)
GFR calc Af Amer: >90 mL/min (ref >90)
Sodium: 136 mEq/L (ref 135–145)

## 2012-12-20 LAB — PRO B NATRIURETIC PEPTIDE: Pro B Natriuretic peptide (BNP): 2844 pg/mL — ABNORMAL HIGH (ref 0–125)

## 2012-12-20 MED ORDER — DILTIAZEM HCL 25 MG/5ML IV SOLN
10.0000 mg | Freq: Once | INTRAVENOUS | Status: DC
  Filled 2012-12-20: qty 5

## 2012-12-20 MED ORDER — DILTIAZEM HCL 25 MG/5ML IV SOLN
15.0000 mg | Freq: Once | INTRAVENOUS | Status: AC
  Administered 2012-12-20: 15 mg via INTRAVENOUS
  Filled 2012-12-20: qty 5

## 2012-12-20 MED ORDER — IOHEXOL 350 MG/ML SOLN
100.0000 mL | Freq: Once | INTRAVENOUS | Status: AC | PRN
  Administered 2012-12-20: 100 mL via INTRAVENOUS

## 2012-12-20 MED ORDER — DILTIAZEM HCL 100 MG IV SOLR
5.0000 mg/h | Freq: Once | INTRAVENOUS | Status: AC
  Administered 2012-12-20: 5 mg/h via INTRAVENOUS
  Filled 2012-12-20: qty 100

## 2012-12-20 NOTE — ED Notes (Signed)
Bed: WA06 Expected date:  Expected time:  Means of arrival:  Comments: EMS/37 yo with SOB/coughing

## 2012-12-20 NOTE — ED Notes (Signed)
Pt states that she started coughing about 35 min ago at the [employer] and felt like she couldn't catch her breath. States hx of blood clots and is noncompliant w/ coumadin.

## 2012-12-21 ENCOUNTER — Encounter (HOSPITAL_COMMUNITY): Payer: Self-pay | Admitting: Internal Medicine

## 2012-12-21 DIAGNOSIS — Z86718 Personal history of other venous thrombosis and embolism: Secondary | ICD-10-CM

## 2012-12-21 DIAGNOSIS — N61 Mastitis without abscess: Secondary | ICD-10-CM | POA: Diagnosis present

## 2012-12-21 DIAGNOSIS — I4891 Unspecified atrial fibrillation: Principal | ICD-10-CM

## 2012-12-21 DIAGNOSIS — N63 Unspecified lump in unspecified breast: Secondary | ICD-10-CM

## 2012-12-21 DIAGNOSIS — I509 Heart failure, unspecified: Secondary | ICD-10-CM

## 2012-12-21 DIAGNOSIS — M7989 Other specified soft tissue disorders: Secondary | ICD-10-CM

## 2012-12-21 LAB — BLOOD GAS, ARTERIAL
Bicarbonate: 20.9 mEq/L (ref 20.0–24.0)
O2 Saturation: 84.7 %
TCO2: 19.5 mmol/L (ref 0–100)
pCO2 arterial: 39.5 mmHg (ref 35.0–45.0)
pH, Arterial: 7.343 — ABNORMAL LOW (ref 7.350–7.450)
pO2, Arterial: 58.1 mmHg — ABNORMAL LOW (ref 80.0–100.0)

## 2012-12-21 LAB — MRSA PCR SCREENING: MRSA by PCR: NEGATIVE

## 2012-12-21 LAB — T3, FREE: T3, Free: 3 pg/mL (ref 2.3–4.2)

## 2012-12-21 LAB — COMPREHENSIVE METABOLIC PANEL
Albumin: 3.7 g/dL (ref 3.5–5.2)
Alkaline Phosphatase: 67 U/L (ref 39–117)
BUN: 12 mg/dL (ref 6–23)
Calcium: 9.6 mg/dL (ref 8.4–10.5)
Chloride: 99 mEq/L (ref 96–112)
Creatinine, Ser: 0.84 mg/dL (ref 0.50–1.10)
GFR calc Af Amer: >90 mL/min (ref >90)
GFR calc non Af Amer: 86 mL/min — ABNORMAL LOW (ref >90)
Potassium: 3.8 mEq/L (ref 3.5–5.1)
Total Bilirubin: 1.1 mg/dL (ref 0.3–1.2)

## 2012-12-21 LAB — CBC WITH DIFFERENTIAL/PLATELET
Hemoglobin: 10.4 g/dL — ABNORMAL LOW (ref 12.0–15.0)
Lymphocytes Relative: 24 % (ref 12–46)
Lymphs Abs: 1.6 10*3/uL (ref 0.7–4.0)
MCV: 91.7 fL (ref 78.0–100.0)
Neutrophils Relative %: 70 % (ref 43–77)
Platelets: 229 10*3/uL (ref 150–400)
RBC: 3.37 MIL/uL — ABNORMAL LOW (ref 3.87–5.11)
WBC: 6.9 10*3/uL (ref 4.0–10.5)

## 2012-12-21 LAB — RAPID URINE DRUG SCREEN, HOSP PERFORMED
Barbiturates: NOT DETECTED
Benzodiazepines: NOT DETECTED
Cocaine: NOT DETECTED
Opiates: NOT DETECTED
Tetrahydrocannabinol: NOT DETECTED

## 2012-12-21 LAB — PREGNANCY, URINE: Preg Test, Ur: NEGATIVE

## 2012-12-21 LAB — TSH: TSH: 0.81 u[IU]/mL (ref 0.350–4.500)

## 2012-12-21 LAB — IRON AND TIBC
Iron: 19 ug/dL — ABNORMAL LOW (ref 42–135)
TIBC: 381 ug/dL (ref 250–470)
UIBC: 362 ug/dL (ref 125–400)

## 2012-12-21 LAB — TROPONIN I: Troponin I: <0.3 ng/mL (ref <0.30)

## 2012-12-21 LAB — PROTIME-INR
INR: 1.32 (ref 0.00–1.49)
Prothrombin Time: 16.1 seconds — ABNORMAL HIGH (ref 11.6–15.2)

## 2012-12-21 LAB — T4, FREE: Free T4: 1.61 ng/dL (ref 0.80–1.80)

## 2012-12-21 LAB — FERRITIN: Ferritin: 27 ng/mL (ref 10–291)

## 2012-12-21 LAB — HEPARIN LEVEL (UNFRACTIONATED): Heparin Unfractionated: 1.09 IU/mL — ABNORMAL HIGH (ref 0.30–0.70)

## 2012-12-21 MED ORDER — MORPHINE SULFATE 4 MG/ML IJ SOLN
4.0000 mg | Freq: Once | INTRAMUSCULAR | Status: AC
  Administered 2012-12-21: 4 mg via INTRAVENOUS
  Filled 2012-12-21: qty 1

## 2012-12-21 MED ORDER — FUROSEMIDE 10 MG/ML IJ SOLN
INTRAMUSCULAR | Status: AC
  Filled 2012-12-21: qty 4

## 2012-12-21 MED ORDER — AMIODARONE HCL IN DEXTROSE 360-4.14 MG/200ML-% IV SOLN
60.0000 mg/h | INTRAVENOUS | Status: AC
  Administered 2012-12-21 (×2): 60 mg/h via INTRAVENOUS
  Filled 2012-12-21 (×2): qty 200

## 2012-12-21 MED ORDER — SODIUM CHLORIDE 0.9 % IJ SOLN
3.0000 mL | Freq: Two times a day (BID) | INTRAMUSCULAR | Status: DC
  Administered 2012-12-21: 3 mL via INTRAVENOUS
  Administered 2012-12-21: 22:00:00 via INTRAVENOUS
  Administered 2012-12-21 – 2012-12-23 (×2): 3 mL via INTRAVENOUS

## 2012-12-21 MED ORDER — AMIODARONE IV BOLUS ONLY 150 MG/100ML: 150.0000 mg | Freq: Once | INTRAVENOUS | Status: DC

## 2012-12-21 MED ORDER — DEXTROSE 5 % IV SOLN: 60.0000 mg/h | Freq: Once | INTRAVENOUS | Status: DC

## 2012-12-21 MED ORDER — ACETAMINOPHEN 325 MG PO TABS: 650.0000 mg | ORAL_TABLET | Freq: Four times a day (QID) | ORAL | Status: DC | PRN

## 2012-12-21 MED ORDER — ACETAMINOPHEN 650 MG RE SUPP: 650.0000 mg | Freq: Four times a day (QID) | RECTAL | Status: DC | PRN

## 2012-12-21 MED ORDER — ONDANSETRON HCL 4 MG PO TABS: 4.0000 mg | ORAL_TABLET | Freq: Four times a day (QID) | ORAL | Status: DC | PRN

## 2012-12-21 MED ORDER — DILTIAZEM HCL 100 MG IV SOLR
5.0000 mg/h | INTRAVENOUS | Status: DC
  Administered 2012-12-21: 5 mg/h via INTRAVENOUS
  Filled 2012-12-21: qty 100

## 2012-12-21 MED ORDER — METOPROLOL TARTRATE 25 MG PO TABS
25.0000 mg | ORAL_TABLET | Freq: Two times a day (BID) | ORAL | Status: DC
  Administered 2012-12-21 – 2012-12-25 (×8): 25 mg via ORAL
  Filled 2012-12-21 (×10): qty 1

## 2012-12-21 MED ORDER — AMIODARONE LOAD VIA INFUSION: 150.0000 mg | Freq: Once | INTRAVENOUS | Status: DC

## 2012-12-21 MED ORDER — HEPARIN (PORCINE) IN NACL 100-0.45 UNIT/ML-% IJ SOLN
1300.0000 [IU]/h | INTRAMUSCULAR | Status: DC
  Filled 2012-12-21 (×2): qty 250

## 2012-12-21 MED ORDER — FUROSEMIDE 10 MG/ML IJ SOLN
INTRAMUSCULAR | Status: AC
  Filled 2012-12-21: qty 2

## 2012-12-21 MED ORDER — FUROSEMIDE 10 MG/ML IJ SOLN
60.0000 mg | Freq: Two times a day (BID) | INTRAMUSCULAR | Status: DC
  Administered 2012-12-22: 60 mg via INTRAVENOUS
  Filled 2012-12-21 (×3): qty 6

## 2012-12-21 MED ORDER — HEPARIN (PORCINE) IN NACL 100-0.45 UNIT/ML-% IJ SOLN
1050.0000 [IU]/h | INTRAMUSCULAR | Status: DC
  Administered 2012-12-21: 1050 [IU]/h via INTRAVENOUS
  Filled 2012-12-21: qty 250

## 2012-12-21 MED ORDER — MORPHINE SULFATE 2 MG/ML IJ SOLN
2.0000 mg | INTRAMUSCULAR | Status: DC | PRN
  Administered 2012-12-21: 2 mg via INTRAVENOUS
  Filled 2012-12-21: qty 1

## 2012-12-21 MED ORDER — DIGOXIN 0.25 MG/ML IJ SOLN
0.2500 mg | Freq: Every day | INTRAMUSCULAR | Status: DC
  Administered 2012-12-21 – 2012-12-22 (×2): 0.25 mg via INTRAVENOUS
  Filled 2012-12-21 (×4): qty 1

## 2012-12-21 MED ORDER — METOPROLOL TARTRATE 1 MG/ML IV SOLN
INTRAVENOUS | Status: AC
  Administered 2012-12-21: 5 mg
  Filled 2012-12-21: qty 5

## 2012-12-21 MED ORDER — ONDANSETRON HCL 4 MG/2ML IJ SOLN
4.0000 mg | Freq: Four times a day (QID) | INTRAMUSCULAR | Status: DC | PRN
  Administered 2012-12-21: 4 mg via INTRAVENOUS
  Filled 2012-12-21: qty 2

## 2012-12-21 MED ORDER — HEPARIN (PORCINE) IN NACL 100-0.45 UNIT/ML-% IJ SOLN
1100.0000 [IU]/h | INTRAMUSCULAR | Status: DC
  Filled 2012-12-21 (×2): qty 250

## 2012-12-21 MED ORDER — SODIUM CHLORIDE 0.9 % IJ SOLN
3.0000 mL | Freq: Two times a day (BID) | INTRAMUSCULAR | Status: DC
  Administered 2012-12-21: 3 mL via INTRAVENOUS
  Administered 2012-12-21: 22:00:00 via INTRAVENOUS
  Administered 2012-12-21 – 2012-12-24 (×3): 3 mL via INTRAVENOUS

## 2012-12-21 MED ORDER — CEFAZOLIN SODIUM-DEXTROSE 2-3 GM-% IV SOLR
2.0000 g | Freq: Three times a day (TID) | INTRAVENOUS | Status: DC
  Administered 2012-12-21 – 2012-12-23 (×6): 2 g via INTRAVENOUS
  Filled 2012-12-21 (×10): qty 50

## 2012-12-21 MED ORDER — HEPARIN (PORCINE) IN NACL 100-0.45 UNIT/ML-% IJ SOLN
1050.0000 [IU]/h | Freq: Once | INTRAMUSCULAR | Status: AC
  Administered 2012-12-21: 1050 [IU]/h via INTRAVENOUS
  Filled 2012-12-21 (×2): qty 250

## 2012-12-21 MED ORDER — METOPROLOL TARTRATE 1 MG/ML IV SOLN: 5.0000 mg | INTRAVENOUS | Status: AC

## 2012-12-21 MED ORDER — METOPROLOL TARTRATE 1 MG/ML IV SOLN
2.5000 mg | Freq: Four times a day (QID) | INTRAVENOUS | Status: DC | PRN
  Administered 2012-12-21: 2.5 mg via INTRAVENOUS
  Filled 2012-12-21: qty 5

## 2012-12-21 MED ORDER — LORAZEPAM 1 MG PO TABS
1.0000 mg | ORAL_TABLET | Freq: Once | ORAL | Status: AC
  Administered 2012-12-21: 1 mg via ORAL
  Filled 2012-12-21: qty 1

## 2012-12-21 MED ORDER — DIGOXIN 0.25 MG/ML IJ SOLN
0.2500 mg | INTRAMUSCULAR | Status: AC
  Administered 2012-12-21: 0.25 mg via INTRAVENOUS
  Filled 2012-12-21: qty 1

## 2012-12-21 MED ORDER — AMIODARONE HCL IN DEXTROSE 360-4.14 MG/200ML-% IV SOLN
30.0000 mg/h | INTRAVENOUS | Status: DC
  Administered 2012-12-21: 30 mg/h via INTRAVENOUS
  Filled 2012-12-21 (×2): qty 200

## 2012-12-21 MED ORDER — CEFAZOLIN SODIUM 1-5 GM-% IV SOLN: 1.0000 g | Freq: Three times a day (TID) | INTRAVENOUS | Status: DC

## 2012-12-21 MED ORDER — DILTIAZEM LOAD VIA INFUSION
15.0000 mg | Freq: Once | INTRAVENOUS | Status: AC
  Administered 2012-12-21: 15 mg via INTRAVENOUS
  Filled 2012-12-21: qty 15

## 2012-12-21 MED ORDER — AMIODARONE LOAD VIA INFUSION
150.0000 mg | Freq: Once | INTRAVENOUS | Status: AC
  Administered 2012-12-21: 150 mg via INTRAVENOUS
  Filled 2012-12-21: qty 83.34

## 2012-12-21 MED ORDER — FUROSEMIDE 10 MG/ML IJ SOLN
40.0000 mg | Freq: Once | INTRAMUSCULAR | Status: AC
  Administered 2012-12-21: 40 mg via INTRAVENOUS
  Filled 2012-12-21: qty 4

## 2012-12-21 MED ORDER — FUROSEMIDE 10 MG/ML IJ SOLN
60.0000 mg | Freq: Three times a day (TID) | INTRAMUSCULAR | Status: DC
  Administered 2012-12-21 (×2): 60 mg via INTRAVENOUS
  Filled 2012-12-21: qty 6

## 2012-12-21 MED ORDER — HEPARIN BOLUS VIA INFUSION
3000.0000 [IU] | Freq: Once | INTRAVENOUS | Status: AC
  Administered 2012-12-21: 3000 [IU] via INTRAVENOUS
  Filled 2012-12-21: qty 3000

## 2012-12-21 MED ORDER — AMIODARONE LOAD VIA INFUSION
150.0000 mg | Freq: Once | INTRAVENOUS | Status: AC
  Administered 2012-12-21: 150 mg via INTRAVENOUS

## 2012-12-21 MED ORDER — SODIUM CHLORIDE 0.9 % IV BOLUS (SEPSIS)
500.0000 mL | Freq: Once | INTRAVENOUS | Status: AC
  Administered 2012-12-21: 500 mL via INTRAVENOUS

## 2012-12-21 NOTE — Consult Note (Addendum)
General surgery attending note:  I have examine this patient and discussed her care with Dr. Mahala Menghini and Ms. Dort,  PA.  The right breast does show some swelling and peau d'orange, but there is no other skin change. The skin is soft without obvious neoplastic change.  No color change and no heat.I do not feel a mass in either breast. Do not see any skin or nipple lesions. Redundant ectopic breast tissue in the right axilla but I do not feel any axillary adenopathy.right arm edema noted.  Assessment/Plan: Clinically, she has evidence of lymphatic, and possibly venous obstruction in the breast and right arm. This could be due to neoplastic or inflammatory problems. Remotely this could be due to a hypercoagulable state but is unlikely.  Clinically, she does not have infectious mastitis, but I think that empiric antibiotics are also reasonable.  At this time, her breast problems do not need immediate workup. We will follow. Eventually she will need bilateral mammograms once her medical problems are stabilized. Unfortunately, this cannot be done in the hospital as our breast imaging Centers are all outpatient. Bedside core biopsy of the skin can be considered but does not seem to be a compelling need at this point in time.    Angelia Mould. Derrell Lolling, M.D., [facility] General and Minimally invasive Surgery Breast and Colorectal Surgery Office:   [phone] Pager:   [phone]

## 2012-12-21 NOTE — Progress Notes (Signed)
ANTICOAGULATION CONSULT NOTE - Follow Up Consult  Pharmacy Consult for IV heparin Indication: atrial fibrillation, h/o DVT, r/o VTE  Allergies  Allergen Reactions  . Tetracyclines & Related Hives    Patient Measurements: Height: 5\' 9"  (175.3 cm) Weight: 247 lb 9.2 oz (112.3 kg) IBW/kg (Calculated) : 66.2 Heparin Dosing Weight: 91.6  Labs:  Recent Labs  12/20/12 2240 12/21/12 0400 12/21/12 1009  HGB 10.9* 10.4*  --   HCT 32.3* 30.9*  --   PLT 243 229  --   HEPARINUNFRC  --   --  0.12*  CREATININE 0.82 0.84  --   TROPONINI  --  <0.30 <0.30    Estimated Creatinine Clearance: 120.1 ml/min (by C-G formula based on Cr of 0.84).   Assessment: 27 yoM with h/o DVTs and afib, noncompliant with Coumadin therapy, presented 10/21 with SOB. EKG showed afib with RVR. Dilt gtt, amiodarone gtt, IV heparin gtt started. CTa neg for PE.   IV heparin running at 1050 units and first heparin level low at 0.12.  Hgb 10.4, stable, no bleeding. Pending bilateral LE doppler and R UE doppler to r/o VTE.  Goal of Therapy:  Heparin level 0.3-0.7 units/ml Monitor platelets by anticoagulation protocol: Yes   Plan:   Give IV heparin 3000 units bolus x 1 then increase Heparin gtt from 1300 units/hr.    Heparin level at 1800   Daily heparin level and CBC  Geoffry Paradise, PharmD, BCPS Pager: [phone] 11:26 AM Pharmacy #: [id number]

## 2012-12-21 NOTE — Progress Notes (Signed)
CARE MANAGEMENT NOTE 12/21/2012  Patient:  Ashley Jones, Ashley Jones   Account Number:  [id number]  Date Initiated:  12/21/2012  Documentation initiated by:  DAVIS,RHONDA  Subjective/Objective Assessment:   pt with hx of a.fib admitted with a.fib with rvr     Action/Plan:   home when stable   Anticipated DC Date:  12/24/2012   Anticipated DC Plan:  HOME/SELF CARE         Choice offered to / List presented to:             Status of service:  In process, will continue to follow Medicare Important Message given?  NO (If response is "NO", the following Medicare IM given date fields will be blank) Date Medicare IM given:   Date Additional Medicare IM given:    Discharge Disposition:    Per UR Regulation:  Reviewed for med. necessity/level of care/duration of stay  If discussed at Long Length of Stay Meetings, dates discussed:    Comments:  [id number]/Rhonda Stark Jock, BSN, [place] [phone] Chart Reviewed for discharge and hospital needs. Discharge needs at time of review:  None Review of patient progress due on [id number].

## 2012-12-21 NOTE — Progress Notes (Signed)
During assessment it was noted that pt had an abnormal right breast it is 3x the size of her left breast and has a orange peel texture, lymph under arm very swollen as well. Pt stated has not had a mammogram in years.

## 2012-12-21 NOTE — Progress Notes (Signed)
*  PRELIMINARY RESULTS* Vascular Ultrasound Right upper extremity venous duplex and bilateral lower extremity venous duplex have been completed.  Preliminary findings: Technically difficult due to body habitus and increased venous pressure. Unable to adequately compress veins. Venous flow appears normal, and no obvious DVT noted in right arm and bilateral legs.   Farrel Demark, RDMS, RVT  12/21/2012, 11:45 AM

## 2012-12-21 NOTE — H&P (Signed)
[facility] History and Physical  Ashley Jones OZH:[id number] DOB: 25-May-1975 DOA: 12/20/2012  Referring physician: ER physician. PCP: No primary provider on file.   Chief Complaint: Shortness of breath and palpitations.  HPI: Ashley Jones is a 37 y.o. female with previous history of paroxysmal atrial fibrillation 2 years ago and history of DVT at the same time 2 years ago and has never had followup with cardiologist her primary care physician's presents to the year because of an onset of shortness of breath and palpitations last night. Patient progressively felt chest tightness and heaviness. In the ER patient was found to be in A. fib with RVR. Chest x-ray showed congestion. Patient was started on Cardizem infusion despite which patient's heart rate still remained high and cardiologist on call Dr. Sharyn Lull was consulted and cardiologist recommended to start amiodarone and heparin infusion. Patient has been limited for further management. Patient otherwise denies any chest pain nausea vomiting abdominal pain diarrhea fever chills. CT angiogram of the chest was negative for PE.   Review of Systems: As presented in the history of presenting illness, rest negative.  Past Medical History  Diagnosis Date  . Hypertension   . Atrial fibrillation   . Lightheadedness   . Chest pain   . SOB (shortness of breath)   . Diaphoresis   . Nauseated   . Bipolar 1 disorder   . History of paranoid schizophrenia   . Multinodular goiter     BY THYROID ULTRASOUND 07/30/2010   Past Surgical History  Procedure Laterality Date  . Transthoracic echocardiogram  07/30/2010    Left ventricle: The cavity size was normal. Wall thickness was increased in a pattern of mild LVH. Systolic function was normal.  The estimated ejection fraction was in the range of 60% to 65%.   Social History:  reports that she quit smoking about 18 years ago. She does not have any smokeless tobacco history on file. She  reports that she does not drink alcohol or use illicit drugs. Where does patient live home. Can patient participate in ADLs? Yes.  Allergies  Allergen Reactions  . Tetracyclines & Related Hives    Family History:  Family History  Problem Relation Age of Onset  . Heart attack Mother   . Hypertension Mother       Prior to Admission medications   Not on File    Physical Exam: Filed Vitals:   12/20/12 2315 12/20/12 2330 12/21/12 0100 12/21/12 0101  BP:   149/99 149/99  Pulse: 95 25 103 63  Temp:      TempSrc:      Resp: 22 25 25 24   Height:    5\' 9"  (1.753 m)  Weight:    88.451 kg (195 lb)  SpO2: 98% 94% 100% 100%     General:  Well-developed and nourished.  Eyes: Anicteric no pallor.  ENT: No discharge from ears eyes nose mouth. Mild fullness of the neck.  Neck: Mild fullness of the neck with elevated JVD.  Cardiovascular: S1-S2 heard tachycardic.  Respiratory: No rhonchi or crepitations.  Abdomen: Soft nontender bowel sounds present.  Skin:  No rash.  Musculoskeletal: No edema.  Psychiatric: Appears normal.  Neurologic: Alert awake oriented to time place and person. Moves all extremities.  Labs on Admission:  Basic Metabolic Panel:  Recent Labs Lab 12/20/12 2240  NA 136  K 4.1  CL 101  CO2 21  GLUCOSE 113*  BUN 11  CREATININE 0.82  CALCIUM 9.5   Liver Function Tests:  No results found for this basename: AST, ALT, ALKPHOS, BILITOT, PROT, ALBUMIN,  in the last 168 hours No results found for this basename: LIPASE, AMYLASE,  in the last 168 hours No results found for this basename: AMMONIA,  in the last 168 hours CBC:  Recent Labs Lab 12/20/12 2240  WBC 7.6  HGB 10.9*  HCT 32.3*  MCV 92.6  PLT 243   Cardiac Enzymes: No results found for this basename: CKTOTAL, CKMB, CKMBINDEX, TROPONINI,  in the last 168 hours  BNP (last 3 results)  Recent Labs  05/11/12 0801 12/20/12 2240  PROBNP 324.7* 2844.0*   CBG: No results found for  this basename: GLUCAP,  in the last 168 hours  Radiological Exams on Admission: Ct Angio Chest W/cm &/or Wo Cm  12/21/2012   *RADIOLOGY REPORT*  Clinical Data: Shortness of breath, weakness, history of deep vein thrombosis and pulmonary embolism.  CT ANGIOGRAPHY CHEST  Technique:  Multidetector CT imaging of the chest using the standard protocol during bolus administration of intravenous contrast. Multiplanar reconstructed images including MIPs were obtained and reviewed to evaluate the vascular anatomy.  Contrast: OMNIPAQUE IOHEXOL 350 MG/ML SOLN  Comparison: CT angiogram of the chest August 17, 2010.  Findings: Main pulmonary artery is not enlarged.  No pulmonary arterial filling defects to the level of the subsegmental branches.  Moderate right and small left layering pleural effusions.  Mild interstitial prominence, with mild bronchial wall thickening, and patchy ground-glass opacities.  No pneumothorax. 3 mm right middle lobe subpleural nodule is likely benign.  The heart appears mild to moderately enlarged, with enlarged right atrium, and reflux of contrast into the liver.thoracic aorta is normal in course and caliber.  Small mediastinal lymph nodes may be reactive.  Diffuse mild anasarca.  Thyroid is difficult to chacterize though, appears enlarged withpunctate calcifications; thyroid ultrasound Jul 30, 2010 demonstrated multinodular goiter.  IMPRESSION: No evidence of pulmonary embolism to the level of subsegmental branches.  Cardiomegaly, enlarged right atrium suggesting congestive heart failure, with moderate right and small left pleural effusion. Vascular congestion with patchy ground-glass opacities and bronchial wall thickening most consistent with pulmonary edema. Mild anasarca.   Original Report Authenticated By: Awilda Metro   Dg Chest Portable 1 View  12/20/2012   CLINICAL DATA:  Shortness of breath and cough.  EXAM: PORTABLE CHEST - 1 VIEW  COMPARISON:  05/11/2012  FINDINGS: The  heart size and mediastinal contours are within normal limits. Both lungs are clear. The visualized skeletal structures are unremarkable.  IMPRESSION: No active disease.   Electronically Signed   By: Burman Nieves M.D.   On: 12/20/2012 22:55    EKG: Independently reviewed. A. fib with RVR.  Assessment/Plan Active Problems:   Atrial fibrillation with rapid ventricular response, paroxysmal   CHF (congestive heart failure)   History of DVT (deep vein thrombosis)   1. A. fib with RVR - patient is on Cardizem, amiodarone, heparin infusion. Check thyroid function test to rule out hypothyroidism as in 2012 patient had low TSH with normal free T3 and T4. Check 2-D echo. Cardiologist to see. Closely monitor in step down. 2. Possible CHF - from #1. I have ordered one dose of Lasix 40 mg IV. Based on the response further doses to be decided. Check 2-D echo. 3. Anemia - check anemia panel. Follow CBC. 4. History of DVT previously - CT angiogram of the chest was negative for any PE at this time.    Code Status: Full code.  Family Communication:  None.  Disposition Plan: Admit to inpatient.    Madhavi Hamblen N. [facility] Pager [phone].  If 7PM-7AM, please contact night-coverage [email or web address] Password [email or web address] 12/21/2012, 2:21 AM

## 2012-12-21 NOTE — Progress Notes (Signed)
Dr. Mahala Menghini called to update on patients low urine output. Patient had 300cc of yellow, clear urine at 1100. Since then the patient has had 50cc of red, cloudy urine. Bladder scan and 10cc of saline flushed in foley per MD. Bladder scan had 80cc of urine. Pharmacy also called to make them aware of red urine incase it was related to the heprain drip that is running. Last level drawn was not at a therapeutic level and pharmacist made aware of situation.

## 2012-12-21 NOTE — Progress Notes (Signed)
Echocardiogram 2D Echocardiogram has been performed.  Ashley Jones 12/21/2012, 11:28 AM

## 2012-12-21 NOTE — Progress Notes (Signed)
ANTICOAGULATION CONSULT NOTE - Initial Consult  Pharmacy Consult for Heparin Indication: atrial fibrillation  Allergies  Allergen Reactions  . Tetracyclines & Related Hives    Patient Measurements: Height: 5\' 9"  (175.3 cm) Weight: 247 lb 9.2 oz (112.3 kg) IBW/kg (Calculated) : 66.2 Heparin Dosing Weight:   Vital Signs: Temp: 98 F (36.7 C) (10/21 0142) Temp src: Oral (10/21 0142) BP: 202/173 mmHg (10/21 0142) Pulse Rate: 76 (10/21 0142)  Labs:  Recent Labs  12/20/12 2240 12/21/12 0400  HGB 10.9* 10.4*  HCT 32.3* 30.9*  PLT 243 229  CREATININE 0.82  --     Estimated Creatinine Clearance: 123 ml/min (by C-G formula based on Cr of 0.82).   Medical History: Past Medical History  Diagnosis Date  . Hypertension   . Atrial fibrillation   . Lightheadedness   . Chest pain   . SOB (shortness of breath)   . Diaphoresis   . Nauseated   . Bipolar 1 disorder   . History of paranoid schizophrenia   . Multinodular goiter     BY THYROID ULTRASOUND 07/30/2010    Medications:  Infusions:  . amiodarone (NEXTERONE PREMIX) 360 mg/200 mL dextrose 60 mg/hr (12/21/12 0158)   Followed by  . amiodarone (NEXTERONE PREMIX) 360 mg/200 mL dextrose    . diltiazem (CARDIZEM) infusion    . heparin      Assessment: Patient with afib.  Heparin started in ED by MD without bolus.  Spoke with admitting MD, wishes for heparin per pharmacy.    Goal of Therapy:  Heparin level 0.3-0.7 units/ml Monitor platelets by anticoagulation protocol: Yes   Plan:  Heparin drip at 1050 units/hr Heparin level at 1000 Daily CBC  Aleene Davidson Crowford 12/21/2012,4:45 AM

## 2012-12-21 NOTE — Consult Note (Signed)
Reason for Consult: Atrial fibrillation with rapid ventricular response Referring Physician: [facility]  Ashley Jones is an 37 y.o. female.  HPI: Patient is 37 year old female with past medical history significant for hypertension, paroxysmal atrial fibrillation, multinodular goiter, hypothyroidism, morbid obesity, remote tobacco abuse, history of DVT right leg in the past, paranoid schizophrenia, came to the ER last night by EMS as patient while in [employer] developed sudden onset of coughing spell associated with palpitation and shortness of breath and was noted to be in A. fib with RVR. Patient received multiple doses of IV Cardizem bolus in the ER and was started on IV amiodarone and heparin without much improvement in heart rate. Patient presently feels groggy but denies any chest pain shortness of breath. Denies any palpitations. Patient states she used to take her clonidine and warfarin which she has stopped. Patient denies any weakness in the arms or legs. States she has chronic leg swelling. Denies any history of PND orthopnea but complains of leg swelling. States she was doing fine until yesterday evening when she suddenly developed palpitations after coughing spell. Denies any fever or chills. Patient received a 5 mg IV Lopressor and 150 g of IV amiodarone with control of heart rate in 100 -110, and dropped her blood pressure in 80s systolic requiring fluid challenge of normal saline with improvement in blood pressure above100. Cardizem has been DC'd.  Past Medical History  Diagnosis Date  . Hypertension   . Atrial fibrillation   . Lightheadedness   . Chest pain   . SOB (shortness of breath)   . Diaphoresis   . Nauseated   . Bipolar 1 disorder   . History of paranoid schizophrenia   . Multinodular goiter     BY THYROID ULTRASOUND 07/30/2010    Past Surgical History  Procedure Laterality Date  . Transthoracic echocardiogram  07/30/2010    Left ventricle: The cavity size was  normal. Wall thickness was increased in a pattern of mild LVH. Systolic function was normal.  The estimated ejection fraction was in the range of 60% to 65%.    Family History  Problem Relation Age of Onset  . Heart attack Mother   . Hypertension Mother     Social History:  reports that she quit smoking about 18 years ago. She does not have any smokeless tobacco history on file. She reports that she does not drink alcohol or use illicit drugs.  Allergies:  Allergies  Allergen Reactions  . Tetracyclines & Related Hives    Medications: I have reviewed the patient's current medications.  Results for orders placed during the hospital encounter of 12/20/12 (from the past 48 hour(s))  PRO B NATRIURETIC PEPTIDE     Status: Abnormal   Collection Time    12/20/12 10:40 PM      Result Value Range   Pro B Natriuretic peptide (BNP) 2844.0 (*) 0 - 125 pg/mL  CBC     Status: Abnormal   Collection Time    12/20/12 10:40 PM      Result Value Range   WBC 7.6  4.0 - 10.5 K/uL   RBC 3.49 (*) 3.87 - 5.11 MIL/uL   Hemoglobin 10.9 (*) 12.0 - 15.0 g/dL   HCT 16.1 (*) 09.6 - 04.5 %   MCV 92.6  78.0 - 100.0 fL   MCH 31.2  26.0 - 34.0 pg   MCHC 33.7  30.0 - 36.0 g/dL   RDW 40.9  81.1 - 91.4 %   Platelets 243  150 - 400 K/uL  BASIC METABOLIC PANEL     Status: Abnormal   Collection Time    12/20/12 10:40 PM      Result Value Range   Sodium 136  135 - 145 mEq/L   Potassium 4.1  3.5 - 5.1 mEq/L   Chloride 101  96 - 112 mEq/L   CO2 21  19 - 32 mEq/L   Glucose, Bld 113 (*) 70 - 99 mg/dL   BUN 11  6 - 23 mg/dL   Creatinine, Ser 6.04  0.50 - 1.10 mg/dL   Calcium 9.5  8.4 - 54.0 mg/dL   GFR calc non Af Amer 89 (*) >90 mL/min   GFR calc Af Amer >90  >90 mL/min   Comment: (NOTE)     The eGFR has been calculated using the CKD EPI equation.     This calculation has not been validated in all clinical situations.     eGFR's persistently <90 mL/min signify possible Chronic Kidney     Disease.  POCT  I-STAT TROPONIN I     Status: None   Collection Time    12/20/12 10:52 PM      Result Value Range   Troponin i, poc 0.00  0.00 - 0.08 ng/mL   Comment 3            Comment: Due to the release kinetics of cTnI,     a negative result within the first hours     of the onset of symptoms does not rule out     myocardial infarction with certainty.     If myocardial infarction is still suspected,     repeat the test at appropriate intervals.  MRSA PCR SCREENING     Status: None   Collection Time    12/21/12  2:34 AM      Result Value Range   MRSA by PCR NEGATIVE  NEGATIVE   Comment:            The GeneXpert MRSA Assay (FDA     approved for NASAL specimens     only), is one component of a     comprehensive MRSA colonization     surveillance program. It is not     intended to diagnose MRSA     infection nor to guide or     monitor treatment for     MRSA infections.  URINE RAPID DRUG SCREEN (HOSP PERFORMED)     Status: None   Collection Time    12/21/12  3:40 AM      Result Value Range   Opiates NONE DETECTED  NONE DETECTED   Cocaine NONE DETECTED  NONE DETECTED   Benzodiazepines NONE DETECTED  NONE DETECTED   Amphetamines NONE DETECTED  NONE DETECTED   Tetrahydrocannabinol NONE DETECTED  NONE DETECTED   Barbiturates NONE DETECTED  NONE DETECTED   Comment:            DRUG SCREEN FOR MEDICAL PURPOSES     ONLY.  IF CONFIRMATION IS NEEDED     FOR ANY PURPOSE, NOTIFY LAB     WITHIN 5 DAYS.                LOWEST DETECTABLE LIMITS     FOR URINE DRUG SCREEN     Drug Class       Cutoff (ng/mL)     Amphetamine      1000     Barbiturate      200  Benzodiazepine   200     Tricyclics       300     Opiates          300     Cocaine          300     THC              50  PREGNANCY, URINE     Status: None   Collection Time    12/21/12  3:40 AM      Result Value Range   Preg Test, Ur NEGATIVE  NEGATIVE   Comment:            THE SENSITIVITY OF THIS     METHODOLOGY IS >20 mIU/mL.   TROPONIN I     Status: None   Collection Time    12/21/12  4:00 AM      Result Value Range   Troponin I <0.30  <0.30 ng/mL   Comment:            Due to the release kinetics of cTnI,     a negative result within the first hours     of the onset of symptoms does not rule out     myocardial infarction with certainty.     If myocardial infarction is still suspected,     repeat the test at appropriate intervals.  COMPREHENSIVE METABOLIC PANEL     Status: Abnormal   Collection Time    12/21/12  4:00 AM      Result Value Range   Sodium 133 (*) 135 - 145 mEq/L   Potassium 3.8  3.5 - 5.1 mEq/L   Chloride 99  96 - 112 mEq/L   CO2 20  19 - 32 mEq/L   Glucose, Bld 136 (*) 70 - 99 mg/dL   BUN 12  6 - 23 mg/dL   Creatinine, Ser 1.61  0.50 - 1.10 mg/dL   Calcium 9.6  8.4 - 09.6 mg/dL   Total Protein 7.0  6.0 - 8.3 g/dL   Albumin 3.7  3.5 - 5.2 g/dL   AST 28  0 - 37 U/L   ALT 31  0 - 35 U/L   Alkaline Phosphatase 67  39 - 117 U/L   Total Bilirubin 1.1  0.3 - 1.2 mg/dL   GFR calc non Af Amer 86 (*) >90 mL/min   GFR calc Af Amer >90  >90 mL/min   Comment: (NOTE)     The eGFR has been calculated using the CKD EPI equation.     This calculation has not been validated in all clinical situations.     eGFR's persistently <90 mL/min signify possible Chronic Kidney     Disease.  CBC WITH DIFFERENTIAL     Status: Abnormal   Collection Time    12/21/12  4:00 AM      Result Value Range   WBC 6.9  4.0 - 10.5 K/uL   RBC 3.37 (*) 3.87 - 5.11 MIL/uL   Hemoglobin 10.4 (*) 12.0 - 15.0 g/dL   HCT 04.5 (*) 40.9 - 81.1 %   MCV 91.7  78.0 - 100.0 fL   MCH 30.9  26.0 - 34.0 pg   MCHC 33.7  30.0 - 36.0 g/dL   RDW 91.4  78.2 - 95.6 %   Platelets 229  150 - 400 K/uL   Neutrophils Relative % 70  43 - 77 %   Neutro Abs 4.8  1.7 - 7.7 K/uL  Lymphocytes Relative 24  12 - 46 %   Lymphs Abs 1.6  0.7 - 4.0 K/uL   Monocytes Relative 6  3 - 12 %   Monocytes Absolute 0.4  0.1 - 1.0 K/uL   Eosinophils Relative  0  0 - 5 %   Eosinophils Absolute 0.0  0.0 - 0.7 K/uL   Basophils Relative 0  0 - 1 %   Basophils Absolute 0.0  0.0 - 0.1 K/uL  RETICULOCYTES     Status: Abnormal   Collection Time    12/21/12  4:00 AM      Result Value Range   Retic Ct Pct 2.8  0.4 - 3.1 %   RBC. 3.33 (*) 3.87 - 5.11 MIL/uL   Retic Count, Manual 93.2  19.0 - 186.0 K/uL  BLOOD GAS, ARTERIAL     Status: Abnormal   Collection Time    12/21/12  8:50 AM      Result Value Range   FIO2 0.21     Delivery systems ROOM AIR     pH, Arterial 7.343 (*) 7.350 - 7.450   pCO2 arterial 39.5  35.0 - 45.0 mmHg   pO2, Arterial 58.1 (*) 80.0 - 100.0 mmHg   Bicarbonate 20.9  20.0 - 24.0 mEq/L   TCO2 19.5  0 - 100 mmol/L   Acid-base deficit 4.0 (*) 0.0 - 2.0 mmol/L   O2 Saturation 84.7     Patient temperature 98.6     Collection site RIGHT RADIAL     Drawn by [id number]     Sample type ARTERIAL     Allens test (pass/fail) PASS  PASS    Ct Angio Chest W/cm &/or Wo Cm  12/21/2012   *RADIOLOGY REPORT*  Clinical Data: Shortness of breath, weakness, history of deep vein thrombosis and pulmonary embolism.  CT ANGIOGRAPHY CHEST  Technique:  Multidetector CT imaging of the chest using the standard protocol during bolus administration of intravenous contrast. Multiplanar reconstructed images including MIPs were obtained and reviewed to evaluate the vascular anatomy.  Contrast: OMNIPAQUE IOHEXOL 350 MG/ML SOLN  Comparison: CT angiogram of the chest August 17, 2010.  Findings: Main pulmonary artery is not enlarged.  No pulmonary arterial filling defects to the level of the subsegmental branches.  Moderate right and small left layering pleural effusions.  Mild interstitial prominence, with mild bronchial wall thickening, and patchy ground-glass opacities.  No pneumothorax. 3 mm right middle lobe subpleural nodule is likely benign.  The heart appears mild to moderately enlarged, with enlarged right atrium, and reflux of contrast into the  liver.thoracic aorta is normal in course and caliber.  Small mediastinal lymph nodes may be reactive.  Diffuse mild anasarca.  Thyroid is difficult to chacterize though, appears enlarged withpunctate calcifications; thyroid ultrasound Jul 30, 2010 demonstrated multinodular goiter.  IMPRESSION: No evidence of pulmonary embolism to the level of subsegmental branches.  Cardiomegaly, enlarged right atrium suggesting congestive heart failure, with moderate right and small left pleural effusion. Vascular congestion with patchy ground-glass opacities and bronchial wall thickening most consistent with pulmonary edema. Mild anasarca.   Original Report Authenticated By: Awilda Metro   Dg Chest Portable 1 View  12/20/2012   CLINICAL DATA:  Shortness of breath and cough.  EXAM: PORTABLE CHEST - 1 VIEW  COMPARISON:  05/11/2012  FINDINGS: The heart size and mediastinal contours are within normal limits. Both lungs are clear. The visualized skeletal structures are unremarkable.  IMPRESSION: No active disease.   Electronically Signed   By:  Burman Nieves M.D.   On: 12/20/2012 22:55    Review of Systems  Constitutional: Negative for fever and chills.  Eyes: Negative for blurred vision and double vision.  Respiratory: Positive for cough. Negative for hemoptysis, sputum production and shortness of breath.   Cardiovascular: Positive for palpitations and leg swelling. Negative for chest pain, orthopnea and claudication.  Gastrointestinal: Negative for nausea, vomiting and abdominal pain.  Genitourinary: Negative for dysuria.  Neurological: Positive for dizziness. Negative for headaches.   Blood pressure 151/99, pulse 31, temperature 98.2 F (36.8 C), temperature source Oral, resp. rate 26, height 5\' 9"  (1.753 m), weight 112.3 kg (247 lb 9.2 oz), last menstrual period 12/17/2012, SpO2 92.00%. Physical Exam  Constitutional: She is oriented to person, place, and time.  HENT:  Head: Normocephalic and atraumatic.   Eyes: Conjunctivae are normal. Pupils are equal, round, and reactive to light. Left eye exhibits no discharge. No scleral icterus.  Neck: Normal range of motion. Neck supple. JVD present. No thyromegaly present.  Cardiovascular:  Irregularly irregular tachycardic S1-S2 soft  Respiratory:  Decreased breath sound at bases poor effort  GI: Soft. Bowel sounds are normal. She exhibits distension. There is no tenderness. There is no rebound.  Musculoskeletal:  No clubbing cyanosis 3+ edema right leg  Neurological: She is alert and oriented to person, place, and time.    Assessment/Plan: Paroxysmal recurrent A. fib with RVR Uncontrolled hypertension Mild decompensated congestive heart failure secondary to preserved LV systolic function Hypothyroidism History of multinodular goiter Remote tobacco abuse History of DVT History of paranoid schizophrenia Morbid obesity Probable obstructive sleep apnea/obesity hypoventilation syndrome Anemia etiology unclear Plan DC IV Cardizem Lopressor 5 mg IV given Amiodarone 150 mg IV bolus given. We'll start on by mouth Lopressor once blood pressure improves. Consider restarting Coumadin. Agree with restarting Lasix once blood pressure stabilizes Restart amiodarone 30 mg per hour once blood pressure stabilizes Ninnie Fein N 12/21/2012, 10:32 AM

## 2012-12-21 NOTE — Progress Notes (Signed)
ANTICOAGULATION CONSULT NOTE - Follow Up Consult  Pharmacy Consult for IV heparin Indication: atrial fibrillation, h/o DVT, r/o VTE  Allergies  Allergen Reactions  . Tetracyclines & Related Hives    Patient Measurements: Height: 5\' 9"  (175.3 cm) Weight: 247 lb 9.2 oz (112.3 kg) IBW/kg (Calculated) : 66.2 Heparin Dosing Weight: 91.6  Labs:  Recent Labs  12/20/12 2240 12/21/12 0400 12/21/12 1009 12/21/12 1434 12/21/12 1848  HGB 10.9* 10.4*  --   --   --   HCT 32.3* 30.9*  --   --   --   PLT 243 229  --   --   --   LABPROT  --   --  16.1*  --   --   INR  --   --  1.32  --   --   HEPARINUNFRC  --   --  0.12*  --  1.09*  CREATININE 0.82 0.84  --   --   --   TROPONINI  --  <0.30 <0.30 <0.30  --     Estimated Creatinine Clearance: 120.1 ml/min (by C-G formula based on Cr of 0.84).   Assessment: 66 yoM with h/o DVTs and afib, noncompliant with Coumadin therapy, presented 10/21 with SOB. EKG showed afib with RVR. Dilt gtt, amiodarone gtt, IV heparin gtt started. CTa neg for PE.   Pending bilateral LE doppler and R UE doppler to r/o VTE.  IV heparin infusing @ 1300 units/hr.  RN noted earlier blood tinged urine.  Previous heparin level was subtherapeutic  HL @ 18:48 = 1.09 on 1300 units/hr  Spoke with RN who verified that urine is now clear and no longer bloody.  Goal of Therapy:  Heparin level 0.3-0.7 units/ml Monitor platelets by anticoagulation protocol: Yes   Plan:   Hold heparin x 1 hr then resume heparin @ 1100 units/hr    Check heparin level 6 hrs after heparin resumed  Daily heparin level and CBC  Terrilee Files, PharmD 12/21/12  7:32 PM

## 2012-12-21 NOTE — ED Provider Notes (Addendum)
CSN: [id number]     Arrival date & time 12/20/12  2212 History   First MD Initiated Contact with Patient 12/20/12 2236     Chief Complaint  Patient presents with  . Shortness of Breath   (Consider location/radiation/quality/duration/timing/severity/associated sxs/prior Treatment) HPI  This is a 37 year old female with history of hypertension, atrial fibrillation, and DVT who presents with cough and shortness of breath. Onset of symptoms happened approximately 1235 minutes prior to arrival. She has a history of DVT but is not currently on Coumadin because she "can't afford her medications." She is also not taking any medications for atrial fibrillation. Patient's initial vital signs were notable for heart rate in the 200s.  Patient denies any chest pain but is short of breath. She reports orthopnea. She endorses bilateral lower extremity swelling.  Past Medical History  Diagnosis Date  . Hypertension   . Atrial fibrillation   . Lightheadedness   . Chest pain   . SOB (shortness of breath)   . Diaphoresis   . Nauseated   . Bipolar 1 disorder   . History of paranoid schizophrenia   . Multinodular goiter     BY THYROID ULTRASOUND 07/30/2010   Past Surgical History  Procedure Laterality Date  . Transthoracic echocardiogram  07/30/2010    Left ventricle: The cavity size was normal. Wall thickness was increased in a pattern of mild LVH. Systolic function was normal.  The estimated ejection fraction was in the range of 60% to 65%.   Family History  Problem Relation Age of Onset  . Heart attack Mother   . Hypertension Mother    History  Substance Use Topics  . Smoking status: Former Smoker    Quit date: 08/05/1994  . Smokeless tobacco: Not on file  . Alcohol Use: No   OB History   Grav Para Term Preterm Abortions TAB SAB Ect Mult Living                 Review of Systems  Constitutional: Negative for fever.  Respiratory: Positive for cough and shortness of breath. Negative for  chest tightness.   Cardiovascular: Negative for chest pain.  Gastrointestinal: Negative for nausea, vomiting and abdominal pain.  Genitourinary: Negative for dysuria.  Musculoskeletal: Negative for back pain.  Neurological: Negative for headaches.  All other systems reviewed and are negative.    Allergies  Tetracyclines & related  Home Medications  No current outpatient prescriptions on file. BP 161/102  Pulse 190  Temp(Src) 97.8 F (36.6 C) (Oral)  Resp 25  SpO2 94%  LMP 12/17/2012 Physical Exam  Nursing note and vitals reviewed. Constitutional: She is oriented to person, place, and time.  Older than stated age, no acute distress  HENT:  Head: Normocephalic and atraumatic.  Mouth/Throat: Oropharynx is clear and moist.  Eyes: Pupils are equal, round, and reactive to light.  Neck: Neck supple. JVD present.  Cardiovascular: Normal heart sounds.   No murmur heard. Tachycardia with a rate of 190, regular rhythm  Pulmonary/Chest: Effort normal. No respiratory distress. Right breast exhibits skin change and tenderness. Breasts are asymmetrical.  Crackles in the bases  Abdominal: Soft. Bowel sounds are normal. There is no tenderness.  Musculoskeletal:  2+ bilateral lower extremity edema  Neurological: She is alert and oriented to person, place, and time.  Skin: Skin is warm and dry.  Psychiatric: She has a normal mood and affect.    ED Course  Procedures (including critical care time)  CRITICAL CARE Performed by: Ross Marcus,  F   Total critical care time: 35 min  Critical care time was exclusive of separately billable procedures and treating other patients.  Critical care was necessary to treat or prevent imminent or life-threatening deterioration.  Critical care was time spent personally by me on the following activities: development of treatment plan with patient and/or surrogate as well as nursing, discussions with consultants, evaluation of patient's  response to treatment, examination of patient, obtaining history from patient or surrogate, ordering and performing treatments and interventions, ordering and review of laboratory studies, ordering and review of radiographic studies, pulse oximetry and re-evaluation of patient's condition.  Labs Review Labs Reviewed  PRO B NATRIURETIC PEPTIDE - Abnormal; Notable for the following:    Pro B Natriuretic peptide (BNP) 2844.0 (*)    All other components within normal limits  CBC - Abnormal; Notable for the following:    RBC 3.49 (*)    Hemoglobin 10.9 (*)    HCT 32.3 (*)    All other components within normal limits  BASIC METABOLIC PANEL - Abnormal; Notable for the following:    Glucose, Bld 113 (*)    GFR calc non Af Amer 89 (*)    All other components within normal limits  POCT I-STAT TROPONIN I   Imaging Review Ct Angio Chest W/cm &/or Wo Cm  12/21/2012   *RADIOLOGY REPORT*  Clinical Data: Shortness of breath, weakness, history of deep vein thrombosis and pulmonary embolism.  CT ANGIOGRAPHY CHEST  Technique:  Multidetector CT imaging of the chest using the standard protocol during bolus administration of intravenous contrast. Multiplanar reconstructed images including MIPs were obtained and reviewed to evaluate the vascular anatomy.  Contrast: OMNIPAQUE IOHEXOL 350 MG/ML SOLN  Comparison: CT angiogram of the chest August 17, 2010.  Findings: Main pulmonary artery is not enlarged.  No pulmonary arterial filling defects to the level of the subsegmental branches.  Moderate right and small left layering pleural effusions.  Mild interstitial prominence, with mild bronchial wall thickening, and patchy ground-glass opacities.  No pneumothorax. 3 mm right middle lobe subpleural nodule is likely benign.  The heart appears mild to moderately enlarged, with enlarged right atrium, and reflux of contrast into the liver.thoracic aorta is normal in course and caliber.  Small mediastinal lymph nodes may be  reactive.  Diffuse mild anasarca.  Thyroid is difficult to chacterize though, appears enlarged withpunctate calcifications; thyroid ultrasound Jul 30, 2010 demonstrated multinodular goiter.  IMPRESSION: No evidence of pulmonary embolism to the level of subsegmental branches.  Cardiomegaly, enlarged right atrium suggesting congestive heart failure, with moderate right and small left pleural effusion. Vascular congestion with patchy ground-glass opacities and bronchial wall thickening most consistent with pulmonary edema. Mild anasarca.   Original Report Authenticated By: Awilda Metro   Dg Chest Portable 1 View  12/20/2012   CLINICAL DATA:  Shortness of breath and cough.  EXAM: PORTABLE CHEST - 1 VIEW  COMPARISON:  05/11/2012  FINDINGS: The heart size and mediastinal contours are within normal limits. Both lungs are clear. The visualized skeletal structures are unremarkable.  IMPRESSION: No active disease.   Electronically Signed   By: Burman Nieves M.D.   On: 12/20/2012 22:55    .  EKG Interpretation   None      Medications  heparin ADULT infusion 100 units/mL (25000 units/250 mL) (not administered)  amiodarone (NEXTERONE) 1.8 mg/mL load via infusion 150 mg (not administered)    Followed by  amiodarone (NEXTERONE PREMIX) 360 mg/200 mL dextrose IV infusion (not  administered)    Followed by  amiodarone (NEXTERONE PREMIX) 360 mg/200 mL dextrose IV infusion (not administered)  diltiazem (CARDIZEM) injection 15 mg (0 mg Intravenous Stopped 12/20/12 2312)  diltiazem (CARDIZEM) 100 mg in dextrose 5 % 100 mL infusion (15 mg/hr Intravenous Rate/Dose Change 12/21/12 0012)  iohexol (OMNIPAQUE) 350 MG/ML injection 100 mL (100 mLs Intravenous Contrast Given 12/20/12 2348)   EKG independently reviewed by myself: Atrial fibrillation with rapid ventricular rate at a rate of 200, no evidence of acute ST elevation, T-wave inversions in the lateral leads MDM   1. Atrial fibrillation with RVR   2.  Heart failure   3. Shortness of breath     This a 37 year old female who presents with acute A. fib with RVR. Onset of symptoms was approximately 35 minutes prior to arrival. She has evidence of heart failure on exam as well as an elevated BNP. She also has a history of DVT and is at risk for pulmonary embolism.  EKG shows A. fib with RVR without ischemic changes. Troponin is negative. BNP is 2844.  Patient was bolused with IV diltiazem. She started on a drip. She maxed out on diltiazem up at 15 mg/hour.  I discussed the patient with Dr. Sharyn Lull who requested that I give patient amiodarone, amiodarone drip as well as start a heparin drip. He will evaluate the patient in the morning. He has requested admission to the hospitalist.    Shon Baton, MD 12/21/12 1610  Shon Baton, MD 12/21/12 [phone]

## 2012-12-21 NOTE — Progress Notes (Addendum)
Jason Nest FAO:[id number] DOB: [birth year] DOA: 12/20/2012 PCP: No primary provider on file.  Brief narrative: 37 y/o ? [id number] admitted 12/20/12 with Afib RVR-noted paroxysmal atrial fibrillation,prior history DVT 2000 recent admission/ED visit 05/11/2012 with paroxysmal A. Fib as well, Chad2Vasc=1, history of hypothyroidism, history of chronic noncompliance, history of multiple behavior medicine admits fo rparanoid schizophrenia , history of acute occlusive DVT right common femoral veins May 2012, multinodular goiter May 2012 who was in her usual state of health yesterday when she was at [facility]. She started to feel lightheaded and tachycardic and decided to seek medical attention. She was found to be in rapid A. Fib with volume overload and states that she's had lower extremity R >L swelling now for the past 3 months with mild shortness of breath. Admission BNP 2844.0, CT angiogram chest no PE however cardiomegaly and moderate pleural effusion and mild anasarca  Past medical history-As per Problem list Chart reviewed as below- See above  Consultants:  Cardiology-Harwani  Gen surgery-  Procedures:  See below  Antibiotics:  Cefazolin 10/21   Subjective  Tachypneic, tachycardic, seems to be breathing somewhat hard. Able to verbalize however, but gets winded Nursing reports was given warfarin for pain/tachypnea overnight excellent states that she's had somewhat of a cough recently, admission BNP    Objective    Interim History: See above  Telemetry: Atrial fibrillation rates 1:30 to 160  Objective: Filed Vitals:   12/21/12 0630 12/21/12 0632 12/21/12 0700 12/21/12 0814  BP:  145/99 151/99   Pulse: 31 96 31   Temp:    98.2 F (36.8 C)  TempSrc:    Oral  Resp: 22 22 26    Height:      Weight:      SpO2: 96% 93% 92%     Intake/Output Summary (Last 24 hours) at 12/21/12 0815 Last data filed at 12/21/12 0600  Gross per 24 hour  Intake 266.53 ml  Output     850 ml  Net -583.47 ml    Exam:  General: alert, somewhat short of breath Cardiovascular: S1-S2 irregularly irregular no murmur rub or gallop, JVD 9 cm Respiratory: tactile vocal resonance fremitus Abdomen: soft nontender nondistended Skinpeau d'orange appearance to right rest not warm not tender some MASD under her right breast, no palpable lymphadenopathy however right arm seemed swollen Neurointact  Data Reviewed: Basic Metabolic Panel:  Recent Labs Lab 12/20/12 2240 12/21/12 0400  NA 136 133*  K 4.1 3.8  CL 101 99  CO2 21 20  GLUCOSE 113* 136*  BUN 11 12  CREATININE 0.82 0.84  CALCIUM 9.5 9.6   Liver Function Tests:  Recent Labs Lab 12/21/12 0400  AST 28  ALT 31  ALKPHOS 67  BILITOT 1.1  PROT 7.0  ALBUMIN 3.7   No results found for this basename: LIPASE, AMYLASE,  in the last 168 hours No results found for this basename: AMMONIA,  in the last 168 hours CBC:  Recent Labs Lab 12/20/12 2240 12/21/12 0400  WBC 7.6 6.9  NEUTROABS  --  4.8  HGB 10.9* 10.4*  HCT 32.3* 30.9*  MCV 92.6 91.7  PLT 243 229   Cardiac Enzymes:  Recent Labs Lab 12/21/12 0400  TROPONINI <0.30   BNP: No components found with this basename: POCBNP,  CBG: No results found for this basename: GLUCAP,  in the last 168 hours  Recent Results (from the past 240 hour(s))  MRSA PCR SCREENING     Status: None   Collection Time  12/21/12  2:34 AM      Result Value Range Status   MRSA by PCR NEGATIVE  NEGATIVE Final   Comment:            The GeneXpert MRSA Assay (FDA     approved for NASAL specimens     only), is one component of a     comprehensive MRSA colonization     surveillance program. It is not     intended to diagnose MRSA     infection nor to guide or     monitor treatment for     MRSA infections.     Studies:              All Imaging reviewed and is as per above notation   Scheduled Meds: .  ceFAZolin (ANCEF) IV  1 g Intravenous Q8H  . furosemide      .  furosemide      . furosemide  60 mg Intravenous Q8H  . sodium chloride  3 mL Intravenous Q12H  . sodium chloride  3 mL Intravenous Q12H   Continuous Infusions: . amiodarone (NEXTERONE PREMIX) 360 mg/200 mL dextrose 30 mg/hr (12/21/12 0814)  . diltiazem (CARDIZEM) infusion 15 mg/hr (12/21/12 0556)  . heparin 1,050 Units/hr (12/21/12 0505)     Assessment/Plan: 1. Atrial fibrillation with rapid ventricular response-Dr.Harwani consulting. Currently on max dose Cardizem 15 mg per hour, amiodarone Gtt 30 mg per hour. Will bolus her another 15 mg of Cardizem to see if this rate controls herAwait further expert instruction regarding her tachycardia from Dr. Sharyn Lull 2. Acute decompensated likely diastolic heart failure-echo pending. BNP elevated, CT shows pulmonary edema. Received one dose of IV Lasix overnight. Will start IV Lasix 60 mg every 8 hourly given significant volume overload. We'll get a blood gas. 3. Acute respiratory failure, undifferentiated-breathing 30 times a minute probably from #2. Blood gas to determine compensation. Management as above 4. History of DVT-right lower extremity is swollen. She has1-2+ pitting  edema. This is more fluid overload and new onset CHF>>DVT. Patient already on IV heparin-will rule out further DVT. 5. Mastitis vs. Inflammatory breast cancer-start empiric Ancef. Surgery consulted regarding need for potential MRI to further delineate. History worrisome as has history of family members with both ovarian and colon cancer. Further management as per surgery 6. History of bipolar affective disorder and psychosis-past history not on any medications currently  7. H/o MNG thyroid 2012-await TSH, T3, T4  Code Status: Full Family Communication: NOne at bedside Disposition Plan: ICU, Under [facility]  Critical care time attestation-I spent over 75 minutes in critical care time with this patient, as well as 15 minutes in time coordinating her care her multiple specialists  including cardiology, general surgery   Pleas Koch, MD  [facility] Pager [phone] 12/21/2012, 8:15 AM    LOS: 1 day

## 2012-12-21 NOTE — Consult Note (Signed)
Ashley Jones 1975/09/15  [id number].    Requesting MD: Dr. Mahala Menghini Chief Complaint/Reason for Consult: Mastitis vs inflammatory breast cancer with axillary swelling HPI:  The following history is obtained mostly from the chart and minimally from the patient due to her decreased mental status state. 37 y/o female [id number] admitted 12/20/12 with Afib RVR-noted paroxysmal atrial fibrillation, prior history DVT 2000 recent admission/ED visit 05/11/2012 with paroxysmal A. Fib as well, Chad2Vasc=1, history of hypothyroidism, history of chronic noncompliance, history of multiple behavior medicine admits for paranoid schizophrenia, history of acute occlusive DVT right common femoral veins May 2012, multinodular goiter May 2012 who was in her usual state of health yesterday when she was at [facility]. She started to feel lightheaded and tachycardic and decided to seek medical attention.   She was found to be in rapid A. Fib with volume overload and states that she's had lower extremity R >L swelling now for the past 3 months with mild shortness of breath.  She tells the nursing staff that her right breast/arm has been swollen for about a year.  On exam she was noted to have Peau d'orange appearance and significant swelling to her right breast compared to the left, and diffuse right arm swelling.  We were asked to evaluate the patient for possible mastitis vs inflammatory breast cancer.  She has a family history of ovarian and colon cancers.   ROS: All systems reviewed and otherwise negative except for as above  Family History  Problem Relation Age of Onset  . Heart attack Mother   . Hypertension Mother     Past Medical History  Diagnosis Date  . Hypertension   . Atrial fibrillation   . Lightheadedness   . Chest pain   . SOB (shortness of breath)   . Diaphoresis   . Nauseated   . Bipolar 1 disorder   . History of paranoid schizophrenia   . Multinodular goiter     BY THYROID ULTRASOUND 07/30/2010     Past Surgical History  Procedure Laterality Date  . Transthoracic echocardiogram  07/30/2010    Left ventricle: The cavity size was normal. Wall thickness was increased in a pattern of mild LVH. Systolic function was normal.  The estimated ejection fraction was in the range of 60% to 65%.    Social History:  reports that she quit smoking about 18 years ago. She does not have any smokeless tobacco history on file. She reports that she does not drink alcohol or use illicit drugs.  Allergies:  Allergies  Allergen Reactions  . Tetracyclines & Related Hives    No prescriptions prior to admission    Blood pressure 151/99, pulse 31, temperature 98.2 F (36.8 C), temperature source Oral, resp. rate 26, height 5\' 9"  (1.753 m), weight 247 lb 9.2 oz (112.3 kg), last menstrual period 12/17/2012, SpO2 92.00%. Physical Exam: General: WD/WN obese AA female who is laying in bed in some mild respiratory distress, appears somewhat sedated HEENT: head is normocephalic, atraumatic.  Sclera are noninjected.  PERRL.  Ears and nose without any masses or lesions.  Mouth is pink and moist Heart: regular, rate, and rhythm.  No obvious murmurs, gallops, or rubs noted.  Palpable pedal pulses could not be palpated due to diffuse swelling, doppler of pulses being done by nursing staff Breast:  Right breast is twice the size of the left.  Right is very edematous and feels indurated more significantly in the Lower and outer quadrants of the right breast.  B/L (R>L) Peau  d'orange skin changes with some mild erythema peri-areola, Right arm is diffusely edematous compared to left.  Right axilla with significant edema, but no palpable LAD noted b/l.   Lungs: Low effort, mild respiratory distress, no wheezes/rhonchi heard Abd: soft, NT/ND, +BS, no masses, hernias, or organomegaly MS: Moves all 4 extremities, edema to all 4 extremities R>L Skin: chronic skin changes to right breast and LE's, skin overall very dry,  decreased turgor Psych: Alert but intermittently sedated, had be be woken up multiple times during exam, but not oriented x3, unable to give me a reliable history  Results for orders placed during the hospital encounter of 12/20/12 (from the past 48 hour(s))  PRO B NATRIURETIC PEPTIDE     Status: Abnormal   Collection Time    12/20/12 10:40 PM      Result Value Range   Pro B Natriuretic peptide (BNP) 2844.0 (*) 0 - 125 pg/mL  CBC     Status: Abnormal   Collection Time    12/20/12 10:40 PM      Result Value Range   WBC 7.6  4.0 - 10.5 K/uL   RBC 3.49 (*) 3.87 - 5.11 MIL/uL   Hemoglobin 10.9 (*) 12.0 - 15.0 g/dL   HCT 16.1 (*) 09.6 - 04.5 %   MCV 92.6  78.0 - 100.0 fL   MCH 31.2  26.0 - 34.0 pg   MCHC 33.7  30.0 - 36.0 g/dL   RDW 40.9  81.1 - 91.4 %   Platelets 243  150 - 400 K/uL  BASIC METABOLIC PANEL     Status: Abnormal   Collection Time    12/20/12 10:40 PM      Result Value Range   Sodium 136  135 - 145 mEq/L   Potassium 4.1  3.5 - 5.1 mEq/L   Chloride 101  96 - 112 mEq/L   CO2 21  19 - 32 mEq/L   Glucose, Bld 113 (*) 70 - 99 mg/dL   BUN 11  6 - 23 mg/dL   Creatinine, Ser 7.82  0.50 - 1.10 mg/dL   Calcium 9.5  8.4 - 95.6 mg/dL   GFR calc non Af Amer 89 (*) >90 mL/min   GFR calc Af Amer >90  >90 mL/min   Comment: (NOTE)     The eGFR has been calculated using the CKD EPI equation.     This calculation has not been validated in all clinical situations.     eGFR's persistently <90 mL/min signify possible Chronic Kidney     Disease.  POCT I-STAT TROPONIN I     Status: None   Collection Time    12/20/12 10:52 PM      Result Value Range   Troponin i, poc 0.00  0.00 - 0.08 ng/mL   Comment 3            Comment: Due to the release kinetics of cTnI,     a negative result within the first hours     of the onset of symptoms does not rule out     myocardial infarction with certainty.     If myocardial infarction is still suspected,     repeat the test at appropriate  intervals.  MRSA PCR SCREENING     Status: None   Collection Time    12/21/12  2:34 AM      Result Value Range   MRSA by PCR NEGATIVE  NEGATIVE   Comment:  The GeneXpert MRSA Assay (FDA     approved for NASAL specimens     only), is one component of a     comprehensive MRSA colonization     surveillance program. It is not     intended to diagnose MRSA     infection nor to guide or     monitor treatment for     MRSA infections.  URINE RAPID DRUG SCREEN (HOSP PERFORMED)     Status: None   Collection Time    12/21/12  3:40 AM      Result Value Range   Opiates NONE DETECTED  NONE DETECTED   Cocaine NONE DETECTED  NONE DETECTED   Benzodiazepines NONE DETECTED  NONE DETECTED   Amphetamines NONE DETECTED  NONE DETECTED   Tetrahydrocannabinol NONE DETECTED  NONE DETECTED   Barbiturates NONE DETECTED  NONE DETECTED   Comment:            DRUG SCREEN FOR MEDICAL PURPOSES     ONLY.  IF CONFIRMATION IS NEEDED     FOR ANY PURPOSE, NOTIFY LAB     WITHIN 5 DAYS.                LOWEST DETECTABLE LIMITS     FOR URINE DRUG SCREEN     Drug Class       Cutoff (ng/mL)     Amphetamine      1000     Barbiturate      200     Benzodiazepine   200     Tricyclics       300     Opiates          300     Cocaine          300     THC              50  PREGNANCY, URINE     Status: None   Collection Time    12/21/12  3:40 AM      Result Value Range   Preg Test, Ur NEGATIVE  NEGATIVE   Comment:            THE SENSITIVITY OF THIS     METHODOLOGY IS >20 mIU/mL.  TROPONIN I     Status: None   Collection Time    12/21/12  4:00 AM      Result Value Range   Troponin I <0.30  <0.30 ng/mL   Comment:            Due to the release kinetics of cTnI,     a negative result within the first hours     of the onset of symptoms does not rule out     myocardial infarction with certainty.     If myocardial infarction is still suspected,     repeat the test at appropriate intervals.  COMPREHENSIVE  METABOLIC PANEL     Status: Abnormal   Collection Time    12/21/12  4:00 AM      Result Value Range   Sodium 133 (*) 135 - 145 mEq/L   Potassium 3.8  3.5 - 5.1 mEq/L   Chloride 99  96 - 112 mEq/L   CO2 20  19 - 32 mEq/L   Glucose, Bld 136 (*) 70 - 99 mg/dL   BUN 12  6 - 23 mg/dL   Creatinine, Ser 1.61  0.50 - 1.10 mg/dL   Calcium 9.6  8.4 - 09.6 mg/dL  Total Protein 7.0  6.0 - 8.3 g/dL   Albumin 3.7  3.5 - 5.2 g/dL   AST 28  0 - 37 U/L   ALT 31  0 - 35 U/L   Alkaline Phosphatase 67  39 - 117 U/L   Total Bilirubin 1.1  0.3 - 1.2 mg/dL   GFR calc non Af Amer 86 (*) >90 mL/min   GFR calc Af Amer >90  >90 mL/min   Comment: (NOTE)     The eGFR has been calculated using the CKD EPI equation.     This calculation has not been validated in all clinical situations.     eGFR's persistently <90 mL/min signify possible Chronic Kidney     Disease.  CBC WITH DIFFERENTIAL     Status: Abnormal   Collection Time    12/21/12  4:00 AM      Result Value Range   WBC 6.9  4.0 - 10.5 K/uL   RBC 3.37 (*) 3.87 - 5.11 MIL/uL   Hemoglobin 10.4 (*) 12.0 - 15.0 g/dL   HCT 91.4 (*) 78.2 - 95.6 %   MCV 91.7  78.0 - 100.0 fL   MCH 30.9  26.0 - 34.0 pg   MCHC 33.7  30.0 - 36.0 g/dL   RDW 21.3  08.6 - 57.8 %   Platelets 229  150 - 400 K/uL   Neutrophils Relative % 70  43 - 77 %   Neutro Abs 4.8  1.7 - 7.7 K/uL   Lymphocytes Relative 24  12 - 46 %   Lymphs Abs 1.6  0.7 - 4.0 K/uL   Monocytes Relative 6  3 - 12 %   Monocytes Absolute 0.4  0.1 - 1.0 K/uL   Eosinophils Relative 0  0 - 5 %   Eosinophils Absolute 0.0  0.0 - 0.7 K/uL   Basophils Relative 0  0 - 1 %   Basophils Absolute 0.0  0.0 - 0.1 K/uL  RETICULOCYTES     Status: Abnormal   Collection Time    12/21/12  4:00 AM      Result Value Range   Retic Ct Pct 2.8  0.4 - 3.1 %   RBC. 3.33 (*) 3.87 - 5.11 MIL/uL   Retic Count, Manual 93.2  19.0 - 186.0 K/uL  BLOOD GAS, ARTERIAL     Status: Abnormal   Collection Time    12/21/12  8:50 AM       Result Value Range   FIO2 0.21     Delivery systems ROOM AIR     pH, Arterial 7.343 (*) 7.350 - 7.450   pCO2 arterial 39.5  35.0 - 45.0 mmHg   pO2, Arterial 58.1 (*) 80.0 - 100.0 mmHg   Bicarbonate 20.9  20.0 - 24.0 mEq/L   TCO2 19.5  0 - 100 mmol/L   Acid-base deficit 4.0 (*) 0.0 - 2.0 mmol/L   O2 Saturation 84.7     Patient temperature 98.6     Collection site RIGHT RADIAL     Drawn by [id number]     Sample type ARTERIAL     Allens test (pass/fail) PASS  PASS   Ct Angio Chest W/cm &/or Wo Cm  12/21/2012   *RADIOLOGY REPORT*  Clinical Data: Shortness of breath, weakness, history of deep vein thrombosis and pulmonary embolism.  CT ANGIOGRAPHY CHEST  Technique:  Multidetector CT imaging of the chest using the standard protocol during bolus administration of intravenous contrast. Multiplanar reconstructed images including MIPs were obtained and  reviewed to evaluate the vascular anatomy.  Contrast: OMNIPAQUE IOHEXOL 350 MG/ML SOLN  Comparison: CT angiogram of the chest August 17, 2010.  Findings: Main pulmonary artery is not enlarged.  No pulmonary arterial filling defects to the level of the subsegmental branches.  Moderate right and small left layering pleural effusions.  Mild interstitial prominence, with mild bronchial wall thickening, and patchy ground-glass opacities.  No pneumothorax. 3 mm right middle lobe subpleural nodule is likely benign.  The heart appears mild to moderately enlarged, with enlarged right atrium, and reflux of contrast into the liver.thoracic aorta is normal in course and caliber.  Small mediastinal lymph nodes may be reactive.  Diffuse mild anasarca.  Thyroid is difficult to chacterize though, appears enlarged withpunctate calcifications; thyroid ultrasound Jul 30, 2010 demonstrated multinodular goiter.  IMPRESSION: No evidence of pulmonary embolism to the level of subsegmental branches.  Cardiomegaly, enlarged right atrium suggesting congestive heart failure, with  moderate right and small left pleural effusion. Vascular congestion with patchy ground-glass opacities and bronchial wall thickening most consistent with pulmonary edema. Mild anasarca.   Original Report Authenticated By: Awilda Metro   Dg Chest Portable 1 View  12/20/2012   CLINICAL DATA:  Shortness of breath and cough.  EXAM: PORTABLE CHEST - 1 VIEW  COMPARISON:  05/11/2012  FINDINGS: The heart size and mediastinal contours are within normal limits. Both lungs are clear. The visualized skeletal structures are unremarkable.  IMPRESSION: No active disease.   Electronically Signed   By: Burman Nieves M.D.   On: 12/20/2012 22:55       Assessment/Plan Right breast swelling/Peau d'orange skin changes - ?mastitis vs inflammatory breast cancer Right arm swelling AFIB with rapid RVR Acute decompensated likely DHF ARF H/o DVT RLE H/o bipolar H/o MNG thyroid  2012, pending TSH, T3, T4  Plan: 1.  Agree with antibiotics prophylactically 2.  Consider punch biopsy of the right breast, but this is not urgent.  This can be done as an outpatient since the patient has no overt signs of inflammatory breast cancer on the skin surface 3.  The Peau d'orange may also be due to venous congestion/obstruction DVT; thus would recommend UE doppler US's 4.  Will definitely need OP workup including mammogram vs MRI   DORT, Lawanna Cecere 12/21/2012, 10:21 AM Pager: [phone]

## 2012-12-22 ENCOUNTER — Inpatient Hospital Stay (HOSPITAL_COMMUNITY): Payer: Medicaid Other

## 2012-12-22 DIAGNOSIS — N61 Mastitis without abscess: Secondary | ICD-10-CM

## 2012-12-22 DIAGNOSIS — E039 Hypothyroidism, unspecified: Secondary | ICD-10-CM

## 2012-12-22 LAB — CBC
MCHC: 33.7 g/dL (ref 30.0–36.0)
Platelets: 197 10*3/uL (ref 150–400)
RBC: 3.2 MIL/uL — ABNORMAL LOW (ref 3.87–5.11)
RDW: 14.1 % (ref 11.5–15.5)
WBC: 6.7 10*3/uL (ref 4.0–10.5)

## 2012-12-22 LAB — HEPARIN LEVEL (UNFRACTIONATED): Heparin Unfractionated: 0.38 IU/mL (ref 0.30–0.70)

## 2012-12-22 MED ORDER — METOPROLOL TARTRATE 1 MG/ML IV SOLN
5.0000 mg | Freq: Once | INTRAVENOUS | Status: AC
  Administered 2012-12-22: 5 mg via INTRAVENOUS

## 2012-12-22 MED ORDER — SODIUM CHLORIDE 0.9 % IJ SOLN
10.0000 mL | INTRAMUSCULAR | Status: DC | PRN
  Administered 2012-12-23: 30 mL
  Administered 2012-12-25: 09:00:00 20 mL

## 2012-12-22 MED ORDER — HEPARIN (PORCINE) IN NACL 100-0.45 UNIT/ML-% IJ SOLN
1250.0000 [IU]/h | INTRAMUSCULAR | Status: DC
  Administered 2012-12-22: 1250 [IU]/h via INTRAVENOUS
  Filled 2012-12-22 (×2): qty 250

## 2012-12-22 MED ORDER — METOPROLOL TARTRATE 1 MG/ML IV SOLN
2.5000 mg | INTRAVENOUS | Status: DC | PRN
  Administered 2012-12-23: 2.5 mg via INTRAVENOUS
  Filled 2012-12-22 (×3): qty 5

## 2012-12-22 MED ORDER — FUROSEMIDE 10 MG/ML IJ SOLN
80.0000 mg | Freq: Two times a day (BID) | INTRAMUSCULAR | Status: DC
  Administered 2012-12-22 – 2012-12-24 (×4): 80 mg via INTRAVENOUS
  Filled 2012-12-22 (×6): qty 8

## 2012-12-22 MED ORDER — AMIODARONE HCL 200 MG PO TABS
400.0000 mg | ORAL_TABLET | Freq: Two times a day (BID) | ORAL | Status: DC
  Administered 2012-12-22 – 2012-12-24 (×4): 400 mg via ORAL
  Filled 2012-12-22 (×6): qty 2

## 2012-12-22 MED ORDER — DIGOXIN 0.25 MG/ML IJ SOLN
0.2500 mg | Freq: Once | INTRAMUSCULAR | Status: AC
  Administered 2012-12-22: 0.25 mg via INTRAVENOUS
  Filled 2012-12-22: qty 1

## 2012-12-22 MED ORDER — SODIUM CHLORIDE 0.9 % IJ SOLN
10.0000 mL | Freq: Two times a day (BID) | INTRAMUSCULAR | Status: DC
  Administered 2012-12-22 – 2012-12-23 (×2): 10 mL

## 2012-12-22 NOTE — Progress Notes (Signed)
Unable to get an SpO2 on pt. attempted on ears, fingers, toes & forehead. Also tried portable as well. Applied heat pack but it was of no help also.

## 2012-12-22 NOTE — Progress Notes (Signed)
Unable to draw morning labs due to poor vascular assess. Lab & RN made 6 attempts. Paged [facility] Merdis Delay, NP to advise, order entered for PICC

## 2012-12-22 NOTE — Progress Notes (Signed)
Subjective: Pt says her right breast is less swollen and uncomfortable, no complaints of arm pain.   Her breathing is better, HR still up and in AF.  Objective: Vital signs in last 24 hours: Temp:  [97.5 F (36.4 C)-98.8 F (37.1 C)] 98.8 F (37.1 C) (10/22 0400) Pulse Rate:  [29-117] 31 (10/21 2200) Resp:  [12-31] 15 (10/22 0600) BP: (58-167)/(19-136) 128/91 mmHg (10/22 0600) SpO2:  [88 %-100 %] 100 % (10/21 1800) Last BM Date: 12/20/12 360 PO Emesis x 1 yesterday Ongoing tachycardia,  Variable BP, afebrile No labs Intake/Output from previous day: 10/21 0701 - 10/22 0700 In: 2201.7 [P.O.:360; I.V.:1241.7; IV Piggyback:600] Out: 2000 [Urine:2000] Intake/Output this shift:    General appearance: alert, cooperative and no distress Breasts: Right breast is larger than the left, she says it is smaller than yesterday.  It is not painful to palpation.  She still has Peau d' orange skin changes.  Lab Results:   Recent Labs  12/20/12 2240 12/21/12 0400  WBC 7.6 6.9  HGB 10.9* 10.4*  HCT 32.3* 30.9*  PLT 243 229    BMET  Recent Labs  12/20/12 2240 12/21/12 0400  NA 136 133*  K 4.1 3.8  CL 101 99  CO2 21 20  GLUCOSE 113* 136*  BUN 11 12  CREATININE 0.82 0.84  CALCIUM 9.5 9.6   PT/INR  Recent Labs  12/21/12 1009  LABPROT 16.1*  INR 1.32     Recent Labs Lab 12/21/12 0400  AST 28  ALT 31  ALKPHOS 67  BILITOT 1.1  PROT 7.0  ALBUMIN 3.7     Lipase  No results found for this basename: lipase     Studies/Results: Ct Angio Chest W/cm &/or Wo Cm  12/21/2012   *RADIOLOGY REPORT*  Clinical Data: Shortness of breath, weakness, history of deep vein thrombosis and pulmonary embolism.  CT ANGIOGRAPHY CHEST  Technique:  Multidetector CT imaging of the chest using the standard protocol during bolus administration of intravenous contrast. Multiplanar reconstructed images including MIPs were obtained and reviewed to evaluate the vascular anatomy.  Contrast:  OMNIPAQUE IOHEXOL 350 MG/ML SOLN  Comparison: CT angiogram of the chest August 17, 2010.  Findings: Main pulmonary artery is not enlarged.  No pulmonary arterial filling defects to the level of the subsegmental branches.  Moderate right and small left layering pleural effusions.  Mild interstitial prominence, with mild bronchial wall thickening, and patchy ground-glass opacities.  No pneumothorax. 3 mm right middle lobe subpleural nodule is likely benign.  The heart appears mild to moderately enlarged, with enlarged right atrium, and reflux of contrast into the liver.thoracic aorta is normal in course and caliber.  Small mediastinal lymph nodes may be reactive.  Diffuse mild anasarca.  Thyroid is difficult to chacterize though, appears enlarged withpunctate calcifications; thyroid ultrasound Jul 30, 2010 demonstrated multinodular goiter.  IMPRESSION: No evidence of pulmonary embolism to the level of subsegmental branches.  Cardiomegaly, enlarged right atrium suggesting congestive heart failure, with moderate right and small left pleural effusion. Vascular congestion with patchy ground-glass opacities and bronchial wall thickening most consistent with pulmonary edema. Mild anasarca.   Original Report Authenticated By: Awilda Metro   Dg Chest Portable 1 View  12/20/2012   CLINICAL DATA:  Shortness of breath and cough.  EXAM: PORTABLE CHEST - 1 VIEW  COMPARISON:  05/11/2012  FINDINGS: The heart size and mediastinal contours are within normal limits. Both lungs are clear. The visualized skeletal structures are unremarkable.  IMPRESSION: No active disease.  Electronically Signed   By: Burman Nieves M.D.   On: 12/20/2012 22:55    Medications: .  ceFAZolin (ANCEF) IV  2 g Intravenous Q8H  . digoxin  0.25 mg Intravenous Daily  . furosemide  60 mg Intravenous Q12H  . metoprolol  5 mg Intravenous STAT  . metoprolol tartrate  25 mg Oral BID  . sodium chloride  3 mL Intravenous Q12H  . sodium chloride   3 mL Intravenous Q12H   . amiodarone (NEXTERONE PREMIX) 360 mg/200 mL dextrose 30 mg/hr (12/21/12 1800)  . heparin 1,100 Units/hr (12/21/12 2030)    Assessment/Plan Right breast swelling/Peau d'orange skin changes - lymphatic vs venous obstruction, inflammatory vs neoplastic etiology uncertain. Right arm swelling  AFIB with rapid RVR  Acute decompensated likely DHF  ARF  H/o DVT RLE  H/o bipolar  H/o MNG thyroid 2012, pending TSH, T3, T4   PLan:  Ongoing treatment of AF with RVR.  She will bilateral diagnostic mammograms and ultrasound as an outpatient. This can be scheduled at discharge and she can follow up with Dr. Derrell Lolling after those studies are complete.  LOS: 2 days    Lorinda Copland 12/22/2012

## 2012-12-22 NOTE — Progress Notes (Signed)
Name Relation to Pt Service Area Active? Acct Type   Ashley Jones Self [facility] Yes Personal/Family  Ssn: [id number]   Address Phone       [address] [place], [place] [address] [phone](    Email sent to [place] at the adult [facility] for post hospital follow visit and to establish med. care

## 2012-12-22 NOTE — Progress Notes (Signed)
[facility] PROGRESS NOTE  Ashley Jones ZOX:[id number] DOB: 03-06-1975 DOA: 12/20/2012 PCP: No primary provider on file.  Brief narrative:  37 y/o ? [id number] admitted 12/20/12 with Afib RVR-noted paroxysmal atrial fibrillation,prior history DVT 2000 recent admission/ED visit 05/11/2012 with paroxysmal A. Fib as well, Chad2Vasc=1, history of hypothyroidism, history of chronic noncompliance, history of multiple behavior medicine admits fo rparanoid schizophrenia , history of acute occlusive DVT right common femoral veins May 2012, multinodular goiter May 2012 who was in her usual state of health yesterday when she was at [facility]. She started to feel lightheaded and tachycardic and decided to seek medical attention.  She was found to be in rapid A. Fib with volume overload and states that she's had lower extremity R >L swelling now for the past 3 months with mild shortness of breath.  Admission BNP 2844.0, CT angiogram chest no PE however cardiomegaly and moderate pleural effusion and mild anasarca  Assessment/Plan  Atrial fibrillation with rapid ventricular response.  Became hypotensive with dilt yesterday.  Started on amiodarone with improvement in BP but HR still in the 140s. -  Paged Dr.Harwani regarding possibility of electrical cardioversion -  Continue heparin gtt -  Continue amiodarone   Acute systolic heart failure.  ECHO demonstrates EF of 35-40% with diffuse hypokinesis, moderate MR, moderately increases PA peak pressure and moderate TR, small pericardial effusion. - +260 yesterday despite lasix -  Goal of -1 to 2L -  Increase lasix to 80mg  BID  Acute respiratory failure, undifferentiated, likely hypoxia secondary to pulmonary edema.   -  Continue diuresis.    History of DVT, but duplex of RUE is negative.    Mastitis vs. Inflammatory breast cancer -  Continue empiric Ancef for possible mastitis -  Outpatient skin biopsy  -  Outpatient mammogram  -  F/u with Dr.  Derrell Lolling  History of bipolar affective disorder and psychosis-past history not on any medications currently  H/o MNG thyroid 2012, TFTs wnl  Diet:  Healthy heart Access:  PIV IVF:  OFF Proph:  heparin  Code Status: full Family Communication: patient alone Disposition Plan: pending improvement in heart rate.  Continue ICU pending cardiology assessment.  D/c foley catheter.     Consultants:  Cardiology-Harwani  Gen surgery- Procedures:  See below Antibiotics:  Cefazolin 10/21  HPI/Subjective:  Patient states that her SOB, cough have improved.  She is eating, voiding, and having BMs without difficulty.  Denies palpitations or chest pressure.    Objective: Filed Vitals:   12/22/12 0800 12/22/12 0900 12/22/12 0956 12/22/12 1000  BP: 108/54 118/93 118/93   Pulse:   140   Temp: 97.6 F (36.4 C)     TempSrc: Oral     Resp: 22 15  21   Height:      Weight:      SpO2:        Intake/Output Summary (Last 24 hours) at 12/22/12 1155 Last data filed at 12/22/12 1000  Gross per 24 hour  Intake 1318.06 ml  Output   2100 ml  Net -781.94 ml   Filed Weights   12/21/12 0101 12/21/12 0142  Weight: 88.451 kg (195 lb) 112.3 kg (247 lb 9.2 oz)    Exam:   General:  Adult AAF female, No acute distress  HEENT:  NCAT, MMM  Cardiovascular:  IRRR, tachycardic to the 140s, nl S1, S2 no mrg, 2+ pulses, warm extremities  Respiratory:  Diminished at the bilateral bases, but no focal rales or rhonchi.  Possible  faint wheeze, no increased WOB  Abdomen:   NABS, soft, NT/ND  MSK:   Normal tone and bulk, 2+ bilateral LEE  Neuro:  Grossly intact  Data Reviewed: Basic Metabolic Panel:  Recent Labs Lab 12/20/12 2240 12/21/12 0400  NA 136 133*  K 4.1 3.8  CL 101 99  CO2 21 20  GLUCOSE 113* 136*  BUN 11 12  CREATININE 0.82 0.84  CALCIUM 9.5 9.6   Liver Function Tests:  Recent Labs Lab 12/21/12 0400  AST 28  ALT 31  ALKPHOS 67  BILITOT 1.1  PROT 7.0  ALBUMIN 3.7   No  results found for this basename: LIPASE, AMYLASE,  in the last 168 hours No results found for this basename: AMMONIA,  in the last 168 hours CBC:  Recent Labs Lab 12/20/12 2240 12/21/12 0400  WBC 7.6 6.9  NEUTROABS  --  4.8  HGB 10.9* 10.4*  HCT 32.3* 30.9*  MCV 92.6 91.7  PLT 243 229   Cardiac Enzymes:  Recent Labs Lab 12/21/12 0400 12/21/12 1009 12/21/12 1434  TROPONINI <0.30 <0.30 <0.30   BNP (last 3 results)  Recent Labs  05/11/12 0801 12/20/12 2240  PROBNP 324.7* 2844.0*   CBG: No results found for this basename: GLUCAP,  in the last 168 hours  Recent Results (from the past 240 hour(s))  MRSA PCR SCREENING     Status: None   Collection Time    12/21/12  2:34 AM      Result Value Range Status   MRSA by PCR NEGATIVE  NEGATIVE Final   Comment:            The GeneXpert MRSA Assay (FDA     approved for NASAL specimens     only), is one component of a     comprehensive MRSA colonization     surveillance program. It is not     intended to diagnose MRSA     infection nor to guide or     monitor treatment for     MRSA infections.     Studies: Ct Angio Chest W/cm &/or Wo Cm  12/21/2012   *RADIOLOGY REPORT*  Clinical Data: Shortness of breath, weakness, history of deep vein thrombosis and pulmonary embolism.  CT ANGIOGRAPHY CHEST  Technique:  Multidetector CT imaging of the chest using the standard protocol during bolus administration of intravenous contrast. Multiplanar reconstructed images including MIPs were obtained and reviewed to evaluate the vascular anatomy.  Contrast: OMNIPAQUE IOHEXOL 350 MG/ML SOLN  Comparison: CT angiogram of the chest August 17, 2010.  Findings: Main pulmonary artery is not enlarged.  No pulmonary arterial filling defects to the level of the subsegmental branches.  Moderate right and small left layering pleural effusions.  Mild interstitial prominence, with mild bronchial wall thickening, and patchy ground-glass opacities.  No  pneumothorax. 3 mm right middle lobe subpleural nodule is likely benign.  The heart appears mild to moderately enlarged, with enlarged right atrium, and reflux of contrast into the liver.thoracic aorta is normal in course and caliber.  Small mediastinal lymph nodes may be reactive.  Diffuse mild anasarca.  Thyroid is difficult to chacterize though, appears enlarged withpunctate calcifications; thyroid ultrasound Jul 30, 2010 demonstrated multinodular goiter.  IMPRESSION: No evidence of pulmonary embolism to the level of subsegmental branches.  Cardiomegaly, enlarged right atrium suggesting congestive heart failure, with moderate right and small left pleural effusion. Vascular congestion with patchy ground-glass opacities and bronchial wall thickening most consistent with pulmonary edema. Mild anasarca.  Original Report Authenticated By: Awilda Metro   Dg Chest Portable 1 View  12/20/2012   CLINICAL DATA:  Shortness of breath and cough.  EXAM: PORTABLE CHEST - 1 VIEW  COMPARISON:  05/11/2012  FINDINGS: The heart size and mediastinal contours are within normal limits. Both lungs are clear. The visualized skeletal structures are unremarkable.  IMPRESSION: No active disease.   Electronically Signed   By: Burman Nieves M.D.   On: 12/20/2012 22:55    Scheduled Meds: .  ceFAZolin (ANCEF) IV  2 g Intravenous Q8H  . digoxin  0.25 mg Intravenous Daily  . furosemide  60 mg Intravenous Q12H  . metoprolol tartrate  25 mg Oral BID  . sodium chloride  10-40 mL Intracatheter Q12H  . sodium chloride  3 mL Intravenous Q12H  . sodium chloride  3 mL Intravenous Q12H   Continuous Infusions: . amiodarone (NEXTERONE PREMIX) 360 mg/200 mL dextrose 30 mg/hr (12/21/12 1800)  . heparin 1,100 Units/hr (12/21/12 2030)    Active Problems:   Atrial fibrillation with rapid ventricular response, paroxysmal   Near syncope   Hypothyroid   CHF (congestive heart failure)   History of DVT (deep vein thrombosis)    Mastitis, acute, possible inflammatory breast CA?    Time spent: 30 min    Evangelene Vora, [facility]  [facility] Pager [phone]. If 7PM-7AM, please contact night-coverage at [email or web address], password [facility] 12/22/2012, 11:55 AM  LOS: 2 days

## 2012-12-22 NOTE — Progress Notes (Addendum)
ANTICOAGULATION CONSULT NOTE - Follow Up Consult  Pharmacy Consult for IV heparin Indication: atrial fibrillation, h/o DVT  Allergies  Allergen Reactions  . Tetracyclines & Related Hives    Patient Measurements: Height: 5\' 9"  (175.3 cm) Weight: 247 lb 9.2 oz (112.3 kg) IBW/kg (Calculated) : 66.2 Heparin Dosing Weight: 91.6  Labs:  Recent Labs  12/20/12 2240 12/21/12 0400  12/21/12 1009 12/21/12 1434 12/21/12 1848 12/22/12 1300 12/22/12 2137  HGB 10.9* 10.4*  --   --   --   --  10.1*  --   HCT 32.3* 30.9*  --   --   --   --  30.0*  --   PLT 243 229  --   --   --   --  197  --   LABPROT  --   --   --  16.1*  --   --   --   --   INR  --   --   --  1.32  --   --   --   --   HEPARINUNFRC  --   --   < > 0.12*  --  1.09* 0.21* 0.38  CREATININE 0.82 0.84  --   --   --   --   --   --   TROPONINI  --  <0.30  --  <0.30 <0.30  --   --   --   < > = values in this interval not displayed.  Assessment: 1 yoM with h/o DVTs and afib, noncompliant with Coumadin therapy, presented 10/21 with SOB. EKG showed afib with RVR. Dilt gtt, amiodarone gtt, IV heparin gtt started. CTa neg for PE.   On 10/21, RN notified pharmacy given red-tinged urine. RN, lab and IV team unable to draw blood from patient necessitate PICC placement this morning.  IV heparin was not interrupted at 1100 units/hr.  Heparin level after PICC placement low at 0.21. Urine/bleeding better, amber in color per RN.   RUE and bilateral LE dopplers with no obvious DVT, but technically difficult to view 2/2 body habitus.   Plan cardioversion 10/23  Hl=0.38 after 1250 units/hr @ Css  At goal, no problems per RN  Goal of Therapy:  HL= 0.3-0.7 units/ml Monitor platelets by anticoagulation protocol: Yes   Plan:   Continue IV heparin @ 1250 units/hr  Recheck HL in am  Daily heparin level and CBC  Pharmacy will f/u   Lorenza Evangelist 12/22/2012 11:23 PM Pharmacy #:  [id number]    Addendum:  Assessment:  HL=0.22 this am  No IV interuptions or bleeding noted per RN  Running thru peripheral line, drawn thru PICC  Plan:  Increase heparin to 1500 units/hr  Recheck in 6 hrs  Planned cardioversion for today  Lorenza Evangelist 12/23/2012 5:29 AM

## 2012-12-22 NOTE — Progress Notes (Signed)
General surgery attending note:  I have examined and interviewed this patient this morning. She is much more awake and alert. I agree with the assessment and treatment plan outlined by Mr. Marlyne Beards, [place].  It is interesting that she states that the breast heaviness, which is always worse on the right than left, cycles with her menstrual periods. She has not had a mammogram in 2 or 3 years, but has not had any significant breast problems.She is pleasant but not a good historian.  Her exam today is similar to yesterday. Significant edema and peau d'orange, right much greater than left but the left side is involved a little bit as well. The skin and nipple and we'll are soft. There is no warmth or tenderness. There is no clinical evidence of inflammatory breast cancer.No cervical or  axillary adenopathy.  Assessment/plan: Bilateral breast edema and peau d'orange. Suspect lymphatic obstruction of unknown cause. This may be a non-infectious mastitis, infectious mastitis (doubt) or a manifestation of neoplastic lymphatic obstruction There is no acute indication for biopsy or surgery When she is discharged, she should be immediately be referred for bilateral mammograms at the [facility] and she can follow up with me in the office after that.   Angelia Mould. Derrell Lolling, M.D., [facility] General and Minimally invasive Surgery Breast and Colorectal Surgery Office:   [phone] Pager:   [phone]

## 2012-12-22 NOTE — Progress Notes (Signed)
Subjective:  Patient remains in A. fib with rapid ventricular response despite receiving multiple doses of IV Cardizem Lopressor and on IV amiodarone. 2-D echo showed moderately depressed LV systolic function as compared to echo done approximately 6 months ago. Patient is more awake alert states breathing has improved discussed with patient at length regarding TEE cardioversion this risk and benefits and agrees for the procedure. Patient states she has eaten breakfast and lunch will schedule her for tomorrow  Objective:  Vital Signs in the last 24 hours: Temp:  [97.6 F (36.4 C)-98.8 F (37.1 C)] 97.6 F (36.4 C) (10/22 0800) Pulse Rate:  [30-140] 140 (10/22 0956) Resp:  [12-23] 21 (10/22 1000) BP: (58-167)/(33-136) 118/93 mmHg (10/22 0956) SpO2:  [100 %] 100 % (10/21 1800)  Intake/Output from previous day: 10/21 0701 - 10/22 0700 In: 2229.4 [P.O.:360; I.V.:1269.4; IV Piggyback:600] Out: 2100 [Urine:2100] Intake/Output from this shift: Total I/O In: 290 [P.O.:240; IV Piggyback:50] Out: -   Physical Exam: Neck: no adenopathy, no carotid bruit, no JVD and supple, symmetrical, trachea midline Lungs: Decreased breath sound at bases with basilar rales air entry improved Heart: irregularly irregular rhythm, S1, S2 normal and Soft systolic murmur and S3 gallop noted Abdomen: soft, non-tender; bowel sounds normal; no masses,  no organomegaly Extremities: No clubbing cyanosis 3+ edema noted  Lab Results:  Recent Labs  12/20/12 2240 12/21/12 0400  WBC 7.6 6.9  HGB 10.9* 10.4*  PLT 243 229    Recent Labs  12/20/12 2240 12/21/12 0400  NA 136 133*  K 4.1 3.8  CL 101 99  CO2 21 20  GLUCOSE 113* 136*  BUN 11 12  CREATININE 0.82 0.84    Recent Labs  12/21/12 1009 12/21/12 1434  TROPONINI <0.30 <0.30   Hepatic Function Panel  Recent Labs  12/21/12 0400  PROT 7.0  ALBUMIN 3.7  AST 28  ALT 31  ALKPHOS 67  BILITOT 1.1   No results found for this basename: CHOL,  in  the last 72 hours No results found for this basename: PROTIME,  in the last 72 hours  Imaging: Imaging results have been reviewed and Ct Angio Chest W/cm &/or Wo Cm  12/21/2012   *RADIOLOGY REPORT*  Clinical Data: Shortness of breath, weakness, history of deep vein thrombosis and pulmonary embolism.  CT ANGIOGRAPHY CHEST  Technique:  Multidetector CT imaging of the chest using the standard protocol during bolus administration of intravenous contrast. Multiplanar reconstructed images including MIPs were obtained and reviewed to evaluate the vascular anatomy.  Contrast: OMNIPAQUE IOHEXOL 350 MG/ML SOLN  Comparison: CT angiogram of the chest August 17, 2010.  Findings: Main pulmonary artery is not enlarged.  No pulmonary arterial filling defects to the level of the subsegmental branches.  Moderate right and small left layering pleural effusions.  Mild interstitial prominence, with mild bronchial wall thickening, and patchy ground-glass opacities.  No pneumothorax. 3 mm right middle lobe subpleural nodule is likely benign.  The heart appears mild to moderately enlarged, with enlarged right atrium, and reflux of contrast into the liver.thoracic aorta is normal in course and caliber.  Small mediastinal lymph nodes may be reactive.  Diffuse mild anasarca.  Thyroid is difficult to chacterize though, appears enlarged withpunctate calcifications; thyroid ultrasound Jul 30, 2010 demonstrated multinodular goiter.  IMPRESSION: No evidence of pulmonary embolism to the level of subsegmental branches.  Cardiomegaly, enlarged right atrium suggesting congestive heart failure, with moderate right and small left pleural effusion. Vascular congestion with patchy ground-glass opacities and bronchial  wall thickening most consistent with pulmonary edema. Mild anasarca.   Original Report Authenticated By: Awilda Metro   Dg Chest Portable 1 View  12/20/2012   CLINICAL DATA:  Shortness of breath and cough.  EXAM: PORTABLE  CHEST - 1 VIEW  COMPARISON:  05/11/2012  FINDINGS: The heart size and mediastinal contours are within normal limits. Both lungs are clear. The visualized skeletal structures are unremarkable.  IMPRESSION: No active disease.   Electronically Signed   By: Burman Nieves M.D.   On: 12/20/2012 22:55    Cardiac Studies:  Assessment/Plan:  Atrial. fib with RVR  hypertension  Mild decompensated congestive heart failure secondary to depressed LV systolic function probably secondary to tachycardia induced Hypothyroidism  History of multinodular goiter  Remote tobacco abuse  History of DVT  History of paranoid schizophrenia  Morbid obesity  Probable obstructive sleep apnea/obesity hypoventilation syndrome  Anemia etiology unclear Plan Increase Lopressor as per orders Change amiodarone to by mouth as per orders We'll schedule for TEE cardioversion tomorrow Keep n.p.o. after midnight  LOS: 2 days    Ashley Jones N 12/22/2012, 12:01 PM

## 2012-12-22 NOTE — Progress Notes (Signed)
Peripherally Inserted Central Catheter/Midline Placement  The IV Nurse has discussed with the patient and/or persons authorized to consent for the patient, the purpose of this procedure and the potential benefits and risks involved with this procedure.  The benefits include less needle sticks, lab draws from the catheter and patient may be discharged home with the catheter.  Risks include, but not limited to, infection, bleeding, blood clot (thrombus formation), and puncture of an artery; nerve damage and irregular heat beat.  Alternatives to this procedure were also discussed.  PICC/Midline Placement Documentation  PICC Triple Lumen 12/22/12 PICC Right Basilic 41 cm 0 cm (Active)  Indication for Insertion or Continuance of Line Poor Vasculature-patient has had multiple peripheral attempts or PIVs lasting less than 24 hours 12/22/2012 11:00 AM  Exposed Catheter (cm) 0 cm 12/22/2012 11:00 AM  Dressing Change Due 12/29/12 12/22/2012 11:00 AM       Stacie Glaze Horton 12/22/2012, 11:35 AM

## 2012-12-22 NOTE — Progress Notes (Signed)
ANTICOAGULATION CONSULT NOTE - Follow Up Consult  Pharmacy Consult for IV heparin Indication: atrial fibrillation, h/o DVT  Allergies  Allergen Reactions  . Tetracyclines & Related Hives    Patient Measurements: Height: 5\' 9"  (175.3 cm) Weight: 247 lb 9.2 oz (112.3 kg) IBW/kg (Calculated) : 66.2 Heparin Dosing Weight: 91.6  Labs:  Recent Labs  12/20/12 2240 12/21/12 0400 12/21/12 1009 12/21/12 1434 12/21/12 1848 12/22/12 1300  HGB 10.9* 10.4*  --   --   --  10.1*  HCT 32.3* 30.9*  --   --   --  30.0*  PLT 243 229  --   --   --  197  LABPROT  --   --  16.1*  --   --   --   INR  --   --  1.32  --   --   --   HEPARINUNFRC  --   --  0.12*  --  1.09* 0.21*  CREATININE 0.82 0.84  --   --   --   --   TROPONINI  --  <0.30 <0.30 <0.30  --   --     Assessment: 67 yoM with h/o DVTs and afib, noncompliant with Coumadin therapy, presented 10/21 with SOB. EKG showed afib with RVR. Dilt gtt, amiodarone gtt, IV heparin gtt started. CTa neg for PE.   On 10/21, RN notified pharmacy given red-tinged urine. RN, lab and IV team unable to draw blood from patient necessitate PICC placement this morning.  IV heparin was not interrupted at 1100 units/hr.  Heparin level after PICC placement low at 0.21. Urine/bleeding better, amber in color per RN.   RUE and bilateral LE dopplers with no obvious DVT, but technically difficult to view 2/2 body habitus.   Plan cardioversion 10/23  Goal of Therapy:  Anti-Xa level 0.6-1.2 units/ml 4hrs after LMWH dose given Monitor platelets by anticoagulation protocol: Yes   Plan:   Increase IV heparin to 1250 units/hr  Repeat heparin level at 2000  Daily heparin level and CBC  Pharmacy will f/u   Geoffry Paradise, PharmD, BCPS Pager: [phone] 1:54 PM Pharmacy #: [id number]

## 2012-12-23 ENCOUNTER — Encounter (HOSPITAL_COMMUNITY): Payer: Self-pay

## 2012-12-23 ENCOUNTER — Ambulatory Visit (HOSPITAL_COMMUNITY): Admission: RE | Admit: 2012-12-23 | Payer: Medicaid Other | Source: Ambulatory Visit | Admitting: Cardiovascular Disease

## 2012-12-23 ENCOUNTER — Encounter (HOSPITAL_COMMUNITY)
Admission: EM | Disposition: A | Discharge status: Home / Self Care | Payer: Self-pay | Source: Home / Self Care | Attending: Internal Medicine

## 2012-12-23 ENCOUNTER — Encounter (HOSPITAL_COMMUNITY): Payer: Medicaid Other | Admitting: Anesthesiology

## 2012-12-23 ENCOUNTER — Inpatient Hospital Stay (HOSPITAL_COMMUNITY): Payer: Medicaid Other | Admitting: Anesthesiology

## 2012-12-23 DIAGNOSIS — I5021 Acute systolic (congestive) heart failure: Secondary | ICD-10-CM

## 2012-12-23 DIAGNOSIS — J90 Pleural effusion, not elsewhere classified: Secondary | ICD-10-CM

## 2012-12-23 DIAGNOSIS — E876 Hypokalemia: Secondary | ICD-10-CM

## 2012-12-23 HISTORY — PX: CARDIOVERSION: SHX1299

## 2012-12-23 HISTORY — PX: TEE WITHOUT CARDIOVERSION: SHX5443

## 2012-12-23 LAB — BASIC METABOLIC PANEL
BUN: 14 mg/dL (ref 6–23)
CO2: 27 mEq/L (ref 19–32)
Calcium: 8.8 mg/dL (ref 8.4–10.5)
Chloride: 97 mEq/L (ref 96–112)
Creatinine, Ser: 1.12 mg/dL — ABNORMAL HIGH (ref 0.50–1.10)
Glucose, Bld: 94 mg/dL (ref 70–99)
Sodium: 132 mEq/L — ABNORMAL LOW (ref 135–145)

## 2012-12-23 LAB — CBC
MCH: 30.9 pg (ref 26.0–34.0)
MCHC: 32.8 g/dL (ref 30.0–36.0)
MCV: 94.3 fL (ref 78.0–100.0)
RBC: 3.14 MIL/uL — ABNORMAL LOW (ref 3.87–5.11)
RDW: 14 % (ref 11.5–15.5)
WBC: 5.8 10*3/uL (ref 4.0–10.5)

## 2012-12-23 LAB — HEPARIN LEVEL (UNFRACTIONATED)
Heparin Unfractionated: 0.54 IU/mL (ref 0.30–0.70)
Heparin Unfractionated: 0.5 IU/mL (ref 0.30–0.70)

## 2012-12-23 SURGERY — ECHOCARDIOGRAM, TRANSESOPHAGEAL
Anesthesia: General

## 2012-12-23 MED ORDER — METOPROLOL TARTRATE 1 MG/ML IV SOLN
5.0000 mg | Freq: Once | INTRAVENOUS | Status: AC
  Administered 2012-12-23: 5 mg via INTRAVENOUS

## 2012-12-23 MED ORDER — DIPHENHYDRAMINE HCL 50 MG/ML IJ SOLN
INTRAMUSCULAR | Status: AC
  Filled 2012-12-23: qty 1

## 2012-12-23 MED ORDER — MIDAZOLAM HCL 10 MG/2ML IJ SOLN
INTRAMUSCULAR | Status: DC | PRN
  Administered 2012-12-23: 2 mg via INTRAVENOUS

## 2012-12-23 MED ORDER — POTASSIUM CHLORIDE 10 MEQ/100ML IV SOLN: 10.0000 meq | INTRAVENOUS | Status: DC

## 2012-12-23 MED ORDER — FENTANYL CITRATE 0.05 MG/ML IJ SOLN
INTRAMUSCULAR | Status: DC | PRN
  Administered 2012-12-23: 50 ug via INTRAVENOUS

## 2012-12-23 MED ORDER — POTASSIUM CHLORIDE CRYS ER 20 MEQ PO TBCR
30.0000 meq | EXTENDED_RELEASE_TABLET | Freq: Once | ORAL | Status: AC
  Administered 2012-12-23: 30 meq via ORAL
  Filled 2012-12-23: qty 1

## 2012-12-23 MED ORDER — HEPARIN (PORCINE) IN NACL 100-0.45 UNIT/ML-% IJ SOLN
1500.0000 [IU]/h | INTRAMUSCULAR | Status: DC
  Filled 2012-12-23 (×3): qty 250

## 2012-12-23 MED ORDER — BUTAMBEN-TETRACAINE-BENZOCAINE 2-2-14 % EX AERO
INHALATION_SPRAY | CUTANEOUS | Status: DC | PRN
  Administered 2012-12-23: 2 via TOPICAL

## 2012-12-23 MED ORDER — SODIUM CHLORIDE 0.9 % IV SOLN: INTRAVENOUS | Status: DC

## 2012-12-23 MED ORDER — FENTANYL CITRATE 0.05 MG/ML IJ SOLN
INTRAMUSCULAR | Status: AC
  Filled 2012-12-23: qty 2

## 2012-12-23 MED ORDER — MIDAZOLAM HCL 5 MG/ML IJ SOLN
INTRAMUSCULAR | Status: AC
  Filled 2012-12-23: qty 2

## 2012-12-23 MED ORDER — METOPROLOL TARTRATE 1 MG/ML IV SOLN
INTRAVENOUS | Status: AC
  Filled 2012-12-23: qty 5

## 2012-12-23 MED ORDER — PROPOFOL 10 MG/ML IV BOLUS
INTRAVENOUS | Status: DC | PRN
  Administered 2012-12-23: 60 mg via INTRAVENOUS

## 2012-12-23 MED ORDER — MAGNESIUM SULFATE 40 MG/ML IJ SOLN
4.0000 g | Freq: Once | INTRAMUSCULAR | Status: AC
  Administered 2012-12-23: 4 g via INTRAVENOUS
  Filled 2012-12-23: qty 100

## 2012-12-23 NOTE — Progress Notes (Signed)
General surgery attending note:  Patient remains alert and in no distress. She states that there is no pain or tenderness in her breasts. No nipple discharge. She is scheduled for cardioversion today.  Exam: Alert. No distress. Still has significant edema and peau d'orange right breast much greater than left but slight changes on the left. No warmth or tenderness. No mass or adenopathy.  Assessment/plan:  Bilateral breast edema and peau d'orange, right much greater than left. Suspect lymphatic obstruction of unknown cause. This may be a non-infectious mastitis, infectious mastitis (doubt) or a manifestation of neoplastic lymphatic obstruction   There is no acute indication for biopsy or surgery   Upon discharge, please schedule for bilateral diagnostic mammograms at the breast center [facility]. Followup with me in the office after the mammograms are performed. I will continue her breast evaluation at that point. I gave her a business card. She expresses understanding and states that she will comply with these instructions.  Will sign off. Please reconsult [place] as needed.   Angelia Mould. Derrell Lolling, M.D., [facility] General and Minimally invasive Surgery Breast and Colorectal Surgery Office:   [phone] Pager:   [phone]

## 2012-12-23 NOTE — CV Procedure (Signed)
PRE-OP DIAGNOSIS:  Atrial fibrillation with RVR.  POST-OP DIAGNOSIS:  Atrial fibrillation converted to Normal sinus rhythm.  OPERATOR:  Orpah Cobb, MD.     ANESTHESIA:  Dr. Jean Rosenthal gave 60 mg. Propafol.  COMPLICATIONS:  None.   OPERATIVE TERM:  DC cardioversion.  The nature of the procedure, risks and alternatives were discussed with the patient who gave informed consent.  OPERATIVE TECHNIQUE:  The patient was sedated with 60 mg. of propafol.  When the patient was no longer responsive to quiet voice, DC cardioversion was performed with 120 J biphasically and synchronously.  Patient converted to normal sinus rhythm.  The patient was then monitored until fully alert and left the procedure area in stable condition.  IMPRESSION:  Successful DC cardioversion.

## 2012-12-23 NOTE — Progress Notes (Signed)
Pharmacy Brief Note: IV heparin  Indication: Atrial fibrillation, h/o DVT  Confirmatory heparin level = 0.50 on 1500 units/hr.   H/H: 9.7/29.6 Plts: 179K  No bleeding or infusion interruptions reported.   Assessment: Heparin level therapeutic x 2 on current dose  Plan: Continue IV heparin at 1500 units/hr. Daily heparin level and CBC   Geoffry Paradise, PharmD, BCPS  Pager: [phone] 9:44 PM  Pharmacy #: [id number]

## 2012-12-23 NOTE — Progress Notes (Addendum)
[facility] PROGRESS NOTE  Ashley Jones ZOX:[id number] DOB: [birth year] DOA: 12/20/2012 PCP: No primary provider on file.  Brief narrative:  37 y/o ? [id number] admitted 12/20/12 with Afib RVR-noted paroxysmal atrial fibrillation,prior history DVT 2000 recent admission/ED visit 05/11/2012 with paroxysmal A. Fib as well, Chad2Vasc=1, history of hypothyroidism, history of chronic noncompliance, history of multiple behavior medicine admits fo rparanoid schizophrenia , history of acute occlusive DVT right common femoral veins May 2012, multinodular goiter May 2012 who was in her usual state of health yesterday when she was at [facility]. She started to feel lightheaded and tachycardic and decided to seek medical attention.  She was found to be in rapid A. Fib with volume overload and states that she's had lower extremity R >L swelling now for the past 3 months with mild shortness of breath.  Admission BNP 2844.0, CT angiogram chest no PE however cardiomegaly and moderate pleural effusion and mild anasarca  Assessment/Plan  Atrial fibrillation with rapid ventricular response.  She became hypotensive after starting diltiazem infusion and was transitioned to amiodarone.  Despite loading with amiodarone and continuous infusion and increasing doses of beta blocker, her HR remained in the 140s.  She was continued on heparin gtt and is undergoing TEE with cardioversion today.   -  Appreciate cardiology assistance -  Rate and rhythm control medications per cardiology -  A/C per cardiology  Acute respiratory failure due to Acute systolic heart failure and right pleural effusion.  ECHO demonstrates EF of 35-40% with diffuse hypokinesis, moderate MR, moderately increases PA peak pressure and moderate TR, small pericardial effusion. - -1.78L since yesterday and weight down 2kg -  Continue increased lasix 80mg  BID  History of DVT, but duplex of RUE is negative.    Mastitis vs. Inflammatory breast  cancer, symptoms have been present for a year and have not worsened in the last several weeks to days.  Mastitis would be less likely.   -  D/c ancef -  Outpatient skin biopsy  -  Outpatient mammogram  -  F/u with Dr. Derrell Lolling  History of bipolar affective disorder and psychosis-past history not on any medications currently  H/o MNG thyroid 2012, TFTs wnl  Hypokalemia and hypomagnesemia, likely secondary to diuretics.  Oral KCl and IV magnesium sulfate ordered.  Diet:  Healthy heart Access:  PIV IVF:  OFF Proph:  heparin  Code Status: full Family Communication: patient alone Disposition Plan:  Continue monitoring on telemetry.  D/c foley catheter today post procedure.  A/C to be arranged by cardiology.  Plan for discharge after further diuresis.    Consultants:  Cardiology-Harwani  Gen surgery- Procedures:  See below Antibiotics:  Cefazolin 10/21  HPI/Subjective:  Patient states that her SOB, cough continue to improve.  She is eating, voiding, and having BMs without difficulty.  Denies palpitations or chest pressure.    Objective: Filed Vitals:   12/23/12 1100 12/23/12 1105 12/23/12 1110 12/23/12 1115  BP: 137/103 142/123 121/78 102/86  Pulse:    72  Temp:      TempSrc:      Resp: 28 23 16 17   Height:      Weight:      SpO2: 99% 98% 99% 100%    Intake/Output Summary (Last 24 hours) at 12/23/12 1128 Last data filed at 12/23/12 0600  Gross per 24 hour  Intake 573.35 ml  Output   2925 ml  Net -2351.65 ml   Filed Weights   12/21/12 0101 12/21/12 0142 12/23/12 0500  Weight: 88.451 kg (195 lb) 112.3 kg (247 lb 9.2 oz) 110.6 kg (243 lb 13.3 oz)    Exam:   General:  Adult AAF female, No acute distress  HEENT:  NCAT, MMM, nasal canula in palce  Cardiovascular:  IRRR, tachycardic to the 140s, nl S1, S2 no mrg, 2+ pulses, warm extremities  Respiratory:  Diminished at the bilateral bases, but no focal rales or rhonchi.  Possible faint wheeze, no increased  WOB  Abdomen:   NABS, soft, NT/ND  MSK:   Normal tone and bulk, 2+ bilateral LEE  Neuro:  Grossly intact  Data Reviewed: Basic Metabolic Panel:  Recent Labs Lab 12/20/12 2240 12/21/12 0400 12/23/12 0340  NA 136 133* 132*  K 4.1 3.8 3.2*  CL 101 99 97  CO2 21 20 27   GLUCOSE 113* 136* 94  BUN 11 12 14   CREATININE 0.82 0.84 1.12*  CALCIUM 9.5 9.6 8.8  MG  --   --  1.5   Liver Function Tests:  Recent Labs Lab 12/21/12 0400  AST 28  ALT 31  ALKPHOS 67  BILITOT 1.1  PROT 7.0  ALBUMIN 3.7   No results found for this basename: LIPASE, AMYLASE,  in the last 168 hours No results found for this basename: AMMONIA,  in the last 168 hours CBC:  Recent Labs Lab 12/20/12 2240 12/21/12 0400 12/22/12 1300 12/23/12 0340  WBC 7.6 6.9 6.7 5.8  NEUTROABS  --  4.8  --   --   HGB 10.9* 10.4* 10.1* 9.7*  HCT 32.3* 30.9* 30.0* 29.6*  MCV 92.6 91.7 93.8 94.3  PLT 243 229 197 179   Cardiac Enzymes:  Recent Labs Lab 12/21/12 0400 12/21/12 1009 12/21/12 1434  TROPONINI <0.30 <0.30 <0.30   BNP (last 3 results)  Recent Labs  05/11/12 0801 12/20/12 2240  PROBNP 324.7* 2844.0*   CBG: No results found for this basename: GLUCAP,  in the last 168 hours  Recent Results (from the past 240 hour(s))  MRSA PCR SCREENING     Status: None   Collection Time    12/21/12  2:34 AM      Result Value Range Status   MRSA by PCR NEGATIVE  NEGATIVE Final   Comment:            The GeneXpert MRSA Assay (FDA     approved for NASAL specimens     only), is one component of a     comprehensive MRSA colonization     surveillance program. It is not     intended to diagnose MRSA     infection nor to guide or     monitor treatment for     MRSA infections.     Studies: Dg Chest Port 1 View  12/22/2012   CLINICAL DATA:  Status post PICC line placement  EXAM: PORTABLE CHEST - 1 VIEW  COMPARISON:  10/20/ 14  FINDINGS: The cardiac shadow is stable. A right-sided PICC line is seen with the  catheter tip in the mid superior vena cava. Increased density is noted on the right consistent with the known right-sided effusion.  IMPRESSION: Status post PICC line as described.   Electronically Signed   By: Alcide Clever M.D.   On: 12/22/2012 12:15    Scheduled Meds: . amiodarone  400 mg Oral BID  . digoxin  0.25 mg Intravenous Daily  . furosemide  80 mg Intravenous BID  . magnesium sulfate 1 - 4 g bolus IVPB  4 g Intravenous  Once  . metoprolol tartrate  25 mg Oral BID  . sodium chloride  10-40 mL Intracatheter Q12H  . sodium chloride  3 mL Intravenous Q12H  . sodium chloride  3 mL Intravenous Q12H   Continuous Infusions: . sodium chloride    . heparin 1,500 Units/hr (12/23/12 0600)    Active Problems:   Atrial fibrillation with rapid ventricular response, paroxysmal   Near syncope   Hypothyroid   CHF (congestive heart failure)   History of DVT (deep vein thrombosis)   Mastitis, acute, possible inflammatory breast CA?    Time spent: 30 min    Ceola Para, [facility]  [facility] Pager [phone]. If 7PM-7AM, please contact night-coverage at [email or web address], password [facility] 12/23/2012, 11:28 AM  LOS: 3 days

## 2012-12-23 NOTE — Transfer of Care (Signed)
Immediate Anesthesia Transfer of Care Note  Patient: Ashley Jones  Procedure(s) Performed: Procedure(s) with comments: TRANSESOPHAGEAL ECHOCARDIOGRAM (TEE) (N/A) - INPATIENT AT [place] WILL RETURN TO [place] PER DOCTOR  BM  CARDIOVERSION (N/A)  Patient Location: PACU and Endoscopy Unit  Anesthesia Type:General  Level of Consciousness: awake and alert   Airway & Oxygen Therapy: Patient Spontanous Breathing and Patient connected to nasal cannula oxygen  Post-op Assessment: Report given to PACU RN, Post -op Vital signs reviewed and stable and Patient moving all extremities  Post vital signs: Reviewed and stable  Complications: No apparent anesthesia complications

## 2012-12-23 NOTE — CV Procedure (Signed)
INDICATIONS:   The patient is 37 year old female with hyperthyroidism and atrial fibrillation with rapid ventricular response which is resistant to control rate with most oral medications.  PROCEDURE:  Informed consent was discussed including risks, benefits and alternatives for the procedure.  Risks include, but are not limited to, cough, sore throat, vomiting, nausea, somnolence, esophageal and stomach trauma or perforation, bleeding, low blood pressure, aspiration, pneumonia, infection, trauma to the teeth and death.    Patient was given sedation.  The oropharynx was anesthetized with topical lidocaine.  The transesophageal probe was inserted in the esophagus and stomach and multiple views were obtained.  Agitated saline was used after the transesophageal probe was removed from the body.  The patient was kept under observation until the patient left the procedure room.  The patient left the procedure room in stable condition.   COMPLICATIONS:  There were no immediate complications.  FINDINGS:  1. LEFT VENTRICLE: The left ventricle is normal in structure and has mild systolic dysfunction.  Wall motion showed mild generalized hypokinesia.  No thrombus or masses seen in the left ventricle.  2. RIGHT VENTRICLE:  The right ventricle is normal in structure and function without any definite thrombus or masses.    3. LEFT ATRIUM:  The left atrium is moderately dilated without any thrombus or masses.  4. LEFT ATRIAL APPENDAGE:  The left atrial appendage is free of any thrombus or masses.  5. RIGHT ATRIUM:  The right atrium is free of any thrombus or masses. It is mildly dilated.   6. ATRIAL SEPTUM:  The atrial septum is aneurysmal and with PFO as seen by agitated saline injection.  7. MITRAL VALVE:  The mitral valve is normal in structure and function with multiple jets of moderate regurgitation. No masses, stenosis or vegetations.  8. TRICUSPID VALVE:  The tricuspid valve is normal in structure  and function with moderate to severe regurgitation. No masses, stenosis or vegetations.  9. AORTIC VALVE:  The aortic valve is normal in structure and function without regurgitation, masses, stenosis or vegetations.   10. PULMONIC VALVE:  The pulmonic valve is normal in structure and function without trivial regurgitation. No masses, stenosis or vegetations.  11. AORTIC ARCH, ASCENDING AND DESCENDING AORTA:  The aorta had no atherosclerosis in the ascending or descending aorta.  The aortic arch was normal.  IMPRESSION:   Mild LV systolic dysfunction. EF 45-50 %. Dilated LA and RA. Aneurysmal Atrial septum with PFO. Moderate MR and moderate to severe TR. Marland Kitchen  RECOMMENDATIONS:    Cardioversion for Atrial fibrillation. Life-style modification.

## 2012-12-23 NOTE — Progress Notes (Signed)
ANTICOAGULATION CONSULT NOTE - Follow Up Consult  Pharmacy Consult for IV heparin Indication: atrial fibrillation, h/o DVT  Allergies  Allergen Reactions  . Tetracyclines & Related Hives    Patient Measurements: Height: 5\' 9"  (175.3 cm) Weight: 243 lb 13.3 oz (110.6 kg) IBW/kg (Calculated) : 66.2 Heparin Dosing Weight: 91.6  Labs:  Recent Labs  12/20/12 2240 12/21/12 0400 12/21/12 1009 12/21/12 1434  12/22/12 1300 12/22/12 2137 12/23/12 0340 12/23/12 1315  HGB 10.9* 10.4*  --   --   --  10.1*  --  9.7*  --   HCT 32.3* 30.9*  --   --   --  30.0*  --  29.6*  --   PLT 243 229  --   --   --  197  --  179  --   LABPROT  --   --  16.1*  --   --   --   --   --   --   INR  --   --  1.32  --   --   --   --   --   --   HEPARINUNFRC  --   --  0.12*  --   < > 0.21* 0.38 0.22* 0.54  CREATININE 0.82 0.84  --   --   --   --   --  1.12*  --   TROPONINI  --  <0.30 <0.30 <0.30  --   --   --   --   --   < > = values in this interval not displayed.  Assessment: 41 yoM with h/o DVTs and afib, noncompliant with Coumadin therapy, presented 10/21 with SOB. EKG showed afib with RVR. Dilt gtt, amiodarone gtt, IV heparin gtt started. CTa neg for PE.   On 10/21, RN notified pharmacy given red-tinged urine. RN, lab and IV team unable to draw blood from patient necessitate PICC placement this morning.  IV heparin was not interrupted at 1100 units/hr.  Heparin level after PICC placement low at 0.21. Urine/bleeding better, amber in color per RN.   RUE and bilateral LE dopplers with no obvious DVT, but technically difficult to view 2/2 body habitus.   cardioversion successful on 10/23  Heparin level now therapeutic after dose increased early this AM for low heparin level  No reported bleeding   Goal of Therapy:  HL= 0.3-0.7 units/ml Monitor platelets by anticoagulation protocol: Yes   Plan:  1) Continue IV heparin at current rate of 1500 units/hr 2) Recheck heparin level tonight at 8pm for  confirmatory level 3) What is plan for PO anticoagulation?   Hessie Knows, PharmD, BCPS Pager [phone] 12/23/2012 1:57 PM

## 2012-12-23 NOTE — Progress Notes (Signed)
  Echocardiogram Echocardiogram Transesophageal has been performed.  Victoire Deans FRANCES 12/23/2012, 1:03 PM

## 2012-12-23 NOTE — Anesthesia Preprocedure Evaluation (Addendum)
Anesthesia Evaluation  Patient identified by MRN, date of birth, ID band Patient awake    Reviewed: Allergy & Precautions, H&P , NPO status , Patient's Chart, lab work & pertinent test results, reviewed documented beta blocker date and time   History of Anesthesia Complications Negative for: history of anesthetic complications  Airway Mallampati: II TM Distance: >3 FB Neck ROM: Full    Dental  (+) Poor Dentition and Dental Advisory Given   Pulmonary former smoker,  breath sounds clear to auscultation  Pulmonary exam normal       Cardiovascular hypertension, Pt. on medications DVT + dysrhythmias (INR 1.32) Rhythm:Irregular Rate:Normal  12/21/12 ECHO: EF 35-40%, mod MR TEE today EF 50%   Neuro/Psych Bipolar Disorder Schizophrenia    GI/Hepatic negative GI ROS, Neg liver ROS,   Endo/Other  Hypothyroidism Morbid obesity  Renal/GU negative Renal ROS     Musculoskeletal   Abdominal (+) + obese,   Peds  Hematology  (+) Blood dyscrasia (Hb 9.7), anemia ,   Anesthesia Other Findings   Reproductive/Obstetrics preg NEG 12/21/12                         Anesthesia Physical Anesthesia Plan  ASA: III  Anesthesia Plan: General   Post-op Pain Management:    Induction: Intravenous  Airway Management Planned: Mask and Natural Airway  Additional Equipment:   Intra-op Plan:   Post-operative Plan:   Informed Consent: I have reviewed the patients History and Physical, chart, labs and discussed the procedure including the risks, benefits and alternatives for the proposed anesthesia with the patient or authorized representative who has indicated his/her understanding and acceptance.   Dental advisory given  Plan Discussed with: CRNA and Surgeon  Anesthesia Plan Comments: (Plan routine monitors, GA for cardioversion)       Anesthesia Quick Evaluation

## 2012-12-23 NOTE — Anesthesia Postprocedure Evaluation (Signed)
  Anesthesia Post-op Note  Patient: Ashley Jones  Procedure(s) Performed: Procedure(s) with comments: TRANSESOPHAGEAL ECHOCARDIOGRAM (TEE) (N/A) - INPATIENT AT [place] WILL RETURN TO [place] PER DOCTOR  BM  CARDIOVERSION (N/A)  Patient Location: PACU and Endoscopy Unit  Anesthesia Type:General  Level of Consciousness: awake and alert   Airway and Oxygen Therapy: Patient Spontanous Breathing and Patient connected to nasal cannula oxygen  Post-op Pain: none  Post-op Assessment: Post-op Vital signs reviewed, Patient's Cardiovascular Status Stable and Respiratory Function Stable  Post-op Vital Signs: Reviewed and stable  Complications: No apparent anesthesia complications

## 2012-12-23 NOTE — Preoperative (Signed)
Beta Blockers   Reason not to administer Beta Blockers:Not Applicable 

## 2012-12-24 ENCOUNTER — Encounter (HOSPITAL_COMMUNITY): Payer: Self-pay | Admitting: Cardiovascular Disease

## 2012-12-24 ENCOUNTER — Inpatient Hospital Stay (HOSPITAL_COMMUNITY): Payer: Medicaid Other

## 2012-12-24 DIAGNOSIS — J90 Pleural effusion, not elsewhere classified: Secondary | ICD-10-CM

## 2012-12-24 LAB — CBC
MCHC: 33.3 g/dL (ref 30.0–36.0)
Platelets: 184 10*3/uL (ref 150–400)
RBC: 3.13 MIL/uL — ABNORMAL LOW (ref 3.87–5.11)
RDW: 14 % (ref 11.5–15.5)
WBC: 5.7 10*3/uL (ref 4.0–10.5)

## 2012-12-24 LAB — BASIC METABOLIC PANEL
Chloride: 95 mEq/L — ABNORMAL LOW (ref 96–112)
GFR calc Af Amer: >90 mL/min (ref >90)
GFR calc non Af Amer: 81 mL/min — ABNORMAL LOW (ref >90)
Glucose, Bld: 122 mg/dL — ABNORMAL HIGH (ref 70–99)
Potassium: 3.2 mEq/L — ABNORMAL LOW (ref 3.5–5.1)
Sodium: 132 mEq/L — ABNORMAL LOW (ref 135–145)

## 2012-12-24 LAB — PRO B NATRIURETIC PEPTIDE: Pro B Natriuretic peptide (BNP): 2257 pg/mL — ABNORMAL HIGH (ref 0–125)

## 2012-12-24 LAB — HEPARIN LEVEL (UNFRACTIONATED): Heparin Unfractionated: 0.49 IU/mL (ref 0.30–0.70)

## 2012-12-24 LAB — MAGNESIUM: Magnesium: 1.9 mg/dL (ref 1.5–2.5)

## 2012-12-24 MED ORDER — POTASSIUM CHLORIDE CRYS ER 20 MEQ PO TBCR
40.0000 meq | EXTENDED_RELEASE_TABLET | Freq: Once | ORAL | Status: AC
  Administered 2012-12-24: 40 meq via ORAL
  Filled 2012-12-24: qty 2

## 2012-12-24 MED ORDER — AMIODARONE HCL 200 MG PO TABS
200.0000 mg | ORAL_TABLET | Freq: Two times a day (BID) | ORAL | Status: DC
  Administered 2012-12-24 – 2012-12-25 (×2): 200 mg via ORAL
  Filled 2012-12-24 (×3): qty 1

## 2012-12-24 MED ORDER — INFLUENZA VAC SPLIT QUAD 0.5 ML IM SUSP
0.5000 mL | INTRAMUSCULAR | Status: AC
  Administered 2012-12-24: 21:00:00 0.5 mL via INTRAMUSCULAR
  Filled 2012-12-24: qty 0.5

## 2012-12-24 MED ORDER — RIVAROXABAN 15 MG PO TABS
15.0000 mg | ORAL_TABLET | Freq: Two times a day (BID) | ORAL | Status: DC
  Administered 2012-12-24 – 2012-12-25 (×3): 15 mg via ORAL
  Filled 2012-12-24 (×5): qty 1

## 2012-12-24 NOTE — Progress Notes (Signed)
Subjective:  Patient underwent TEE cardioversion yesterday tolerated the procedure well by Dr. Algie Coffer. Patient remains in sinus rhythm. Started on oral anticoagulants tolerating well no evidence of bleeding. Denies any chest pain states breathing and leg swelling has improved  Objective:  Vital Signs in the last 24 hours: Temp:  [98.2 F (36.8 C)-98.8 F (37.1 C)] 98.2 F (36.8 C) (10/24 1119) Pulse Rate:  [64-86] 64 (10/24 1119) Resp:  [13-27] 20 (10/24 1119) BP: (114-137)/(65-82) 114/73 mmHg (10/24 1119) SpO2:  [94 %-99 %] 98 % (10/24 1119) Weight:  [113 kg (249 lb 1.9 oz)] 113 kg (249 lb 1.9 oz) (10/24 0400)  Intake/Output from previous day: 10/23 0701 - 10/24 0700 In: 2725 [P.O.:2400; I.V.:225; IV Piggyback:100] Out: 3700 [Urine:3700] Intake/Output from this shift: Total I/O In: 885 [P.O.:720; I.V.:165] Out: 2800 [Urine:2800]  Physical Exam: Neck: no adenopathy, no carotid bruit, no JVD and supple, symmetrical, trachea midline Lungs: Decreased breath sound at bases Heart: regular rate and rhythm, S1, S2 normal and Soft systolic murmur noted Abdomen: soft, non-tender; bowel sounds normal; no masses,  no organomegaly Extremities: No clubbing cyanosis 3+ edema noted  Lab Results:  Recent Labs  12/23/12 0340 12/24/12 0540  WBC 5.8 5.7  HGB 9.7* 9.8*  PLT 179 184    Recent Labs  12/23/12 0340 12/24/12 0540  NA 132* 132*  K 3.2* 3.2*  CL 97 95*  CO2 27 28  GLUCOSE 94 122*  BUN 14 11  CREATININE 1.12* 0.89   No results found for this basename: TROPONINI, CK, MB,  in the last 72 hours Hepatic Function Panel No results found for this basename: PROT, ALBUMIN, AST, ALT, ALKPHOS, BILITOT, BILIDIR, IBILI,  in the last 72 hours No results found for this basename: CHOL,  in the last 72 hours No results found for this basename: PROTIME,  in the last 72 hours  Imaging: Imaging results have been reviewed and Dg Chest Port 1 View  12/24/2012   CLINICAL DATA:  Cough.  Evaluate pleural effusion.  EXAM: PORTABLE CHEST - 1 VIEW  COMPARISON:  12/22/2012  FINDINGS: PICC line tip in the SVC region. Again noted is enlargement of the cardiac silhouette. Again noted are hazy densities at lung bases which may represent effusions. Upper lungs remain clear.  IMPRESSION: Persistent hazy densities at the lung bases that probably represent pleural effusions. Minimal change from the previous examination.  Mild enlargement of the cardiac silhouette.   Electronically Signed   By: Richarda Overlie M.D.   On: 12/24/2012 08:04    Cardiac Studies: Status post Atrial. fib with RVR STATUS post TEE assisted cardioversion hypertension  Mild decompensated congestive heart failure secondary to depressed LV systolic function probably secondary to tachycardia induced  Hypothyroidism  History of multinodular goiter  Remote tobacco abuse  History of DVT  History of paranoid schizophrenia  Morbid obesity  Probable obstructive sleep apnea/obesity hypoventilation syndrome  Anemia etiology unclear Plan Decrease amiodarone to 200 mg twice daily Increase beta blockers as blood pressure and heart rate tolerates I will sign off please call if needed followup with me in one week Assessment/Plan:    LOS: 4 days    Ashley Jones N 12/24/2012, 4:47 PM

## 2012-12-24 NOTE — Progress Notes (Signed)
ANTICOAGULATION CONSULT NOTE - Follow Up Consult  Pharmacy Consult for IV heparin Indication: atrial fibrillation, h/o DVT  Allergies  Allergen Reactions  . Tetracyclines & Related Hives    Patient Measurements: Height: 5\' 9"  (175.3 cm) Weight: 249 lb 1.9 oz (113 kg) IBW/kg (Calculated) : 66.2 Heparin Dosing Weight: 91.6  Labs:  Recent Labs  12/21/12 1009 12/21/12 1434  12/22/12 1300  12/23/12 0340 12/23/12 1315 12/23/12 2114 12/24/12 0540  HGB  --   --   < > 10.1*  --  9.7*  --   --  9.8*  HCT  --   --   --  30.0*  --  29.6*  --   --  29.4*  PLT  --   --   --  197  --  179  --   --  184  LABPROT 16.1*  --   --   --   --   --   --   --   --   INR 1.32  --   --   --   --   --   --   --   --   HEPARINUNFRC 0.12*  --   < > 0.21*  < > 0.22* 0.54 0.50 0.49  CREATININE  --   --   --   --   --  1.12*  --   --  0.89  TROPONINI <0.30 <0.30  --   --   --   --   --   --   --   < > = values in this interval not displayed.  Assessment: 51 yoM with h/o DVTs and afib, noncompliant with Coumadin therapy, presented 10/21 with SOB. EKG showed afib with RVR. Dilt gtt, amiodarone gtt, IV heparin gtt started. CTa neg for PE.   On 10/21, RN notified pharmacy given red-tinged urine. No further episodes reported   RUE and bilateral LE dopplers with no obvious DVT, but technically difficult to view 2/2 body habitus.   Cardioversion successful on 10/23  Heparin level therapeutic today (0.49)  CBC low but stable, no reported bleeding  Goal of Therapy:  HL= 0.3-0.7 units/ml Monitor platelets by anticoagulation protocol: Yes   Plan:   Continue IV heparin at current rate of 1500 units/hr  Daily CBC, heparin level  What is plan for PO anticoagulation?  Loralee Pacas, PharmD, BCPS Pager: [phone] 12/24/2012 7:29 AM

## 2012-12-24 NOTE — Progress Notes (Signed)
[facility] PROGRESS NOTE  Ashley Jones RUE:[id number] DOB: [birth year] DOA: 12/20/2012 PCP: No primary provider on file.  Brief narrative:  37 y/o ? [id number] admitted 12/20/12 with Afib RVR-noted paroxysmal atrial fibrillation,prior history DVT 2000 recent admission/ED visit 05/11/2012 with paroxysmal A. Fib as well, Chad2Vasc=1, history of hypothyroidism, history of chronic noncompliance, history of multiple behavior medicine admits fo rparanoid schizophrenia , history of acute occlusive DVT right common femoral veins May 2012, multinodular goiter May 2012 who was in her usual state of health yesterday when she was at [facility]. She started to feel lightheaded and tachycardic and decided to seek medical attention.   She was found to be in rapid A. Fib with volume overload and states that she's had lower extremity R >L swelling now for the past 3 months with mild shortness of breath.   Admission BNP 2844.0, CT angiogram chest no PE however cardiomegaly and moderate pleural effusion and mild anasarca  Assessment/Plan  Atrial fibrillation with rapid ventricular response.  She became hypotensive after starting diltiazem infusion and was transitioned to amiodarone.  Despite loading with amiodarone and continuous infusion and increasing doses of beta blocker, her HR remained in the 140s.  Underwent successful cardioversion on 10/23  -  Appreciate cardiology assistance -  Rate and rhythm control medications per cardiology -  Transition to xarelto today -  Case management to assist with xarelto  Acute respiratory failure due to Acute systolic heart failure and right pleural effusion.  ECHO demonstrates EF of 35-40% with diffuse hypokinesis, moderate MR, moderately increases PA peak pressure and moderate TR, small pericardial effusion. - - yesterday and weight up 3kg -  Continue lasix 80mg  BID -  No longer requiring oxygen since cardioversion  History of DVT, but duplex of RUE is  negative.    Mastitis vs. Inflammatory breast cancer, symptoms have been present for a year and have not worsened in the last several weeks to days.  Mastitis would be less likely.   -  Outpatient skin biopsy  -  Outpatient mammogram  -  F/u with Dr. Derrell Lolling  History of bipolar affective disorder and psychosis-past history not on any medications currently  H/o MNG thyroid 2012, TFTs wnl  Hypokalemia and hypomagnesemia, likely secondary to diuretics.  Oral KCl and IV magnesium sulfate ordered.  Diet:  Healthy heart Access:  PIV IVF:  OFF Proph:  heparin  Code Status: full Family Communication: patient alone Disposition Plan:  Transfer to telemetry.   Discharge possibly tomorrow.  Consultants:  Cardiology-Harwani  Gen surgery- Procedures:  See below Antibiotics:  Cefazolin 10/21  HPI/Subjective:  Patient states that she feels markedly improved since her cardioversion yesterday.  SOB, energy are much better.     Objective: Filed Vitals:   12/24/12 0800 12/24/12 0900 12/24/12 1000 12/24/12 1119  BP: 119/65   114/73  Pulse: 65 72 73 64  Temp: 98.3 F (36.8 C)   98.2 F (36.8 C)  TempSrc: Oral   Oral  Resp: 14 18 19 20   Height:      Weight:      SpO2: 99% 99% 97% 98%    Intake/Output Summary (Last 24 hours) at 12/24/12 1722 Last data filed at 12/24/12 1500  Gross per 24 hour  Intake   2130 ml  Output   3600 ml  Net  -1470 ml   Filed Weights   12/21/12 0142 12/23/12 0500 12/24/12 0400  Weight: 112.3 kg (247 lb 9.2 oz) 110.6 kg (243 lb 13.3 oz)  113 kg (249 lb 1.9 oz)    Exam:   General:  Adult AAF female, No acute distress  HEENT:  NCAT, MMM  Cardiovascular:  RRR, nl S1, S2 no mrg, 2+ pulses, warm extremities  Respiratory:  Diminished at the bilateral bases, but no focal rales or rhonchi.  Faint wheeze, no increased WOB  Abdomen:   NABS, soft, NT/ND  MSK:   Normal tone and bulk, 1+ bilateral LEE  Neuro:  Grossly intact  Data Reviewed: Basic  Metabolic Panel:  Recent Labs Lab 12/20/12 2240 12/21/12 0400 12/23/12 0340 12/24/12 0540  NA 136 133* 132* 132*  K 4.1 3.8 3.2* 3.2*  CL 101 99 97 95*  CO2 21 20 27 28   GLUCOSE 113* 136* 94 122*  BUN 11 12 14 11   CREATININE 0.82 0.84 1.12* 0.89  CALCIUM 9.5 9.6 8.8 8.8  MG  --   --  1.5 1.9   Liver Function Tests:  Recent Labs Lab 12/21/12 0400  AST 28  ALT 31  ALKPHOS 67  BILITOT 1.1  PROT 7.0  ALBUMIN 3.7   No results found for this basename: LIPASE, AMYLASE,  in the last 168 hours No results found for this basename: AMMONIA,  in the last 168 hours CBC:  Recent Labs Lab 12/20/12 2240 12/21/12 0400 12/22/12 1300 12/23/12 0340 12/24/12 0540  WBC 7.6 6.9 6.7 5.8 5.7  NEUTROABS  --  4.8  --   --   --   HGB 10.9* 10.4* 10.1* 9.7* 9.8*  HCT 32.3* 30.9* 30.0* 29.6* 29.4*  MCV 92.6 91.7 93.8 94.3 93.9  PLT 243 229 197 179 184   Cardiac Enzymes:  Recent Labs Lab 12/21/12 0400 12/21/12 1009 12/21/12 1434  TROPONINI <0.30 <0.30 <0.30   BNP (last 3 results)  Recent Labs  05/11/12 0801 12/20/12 2240 12/24/12 0540  PROBNP 324.7* 2844.0* 2257.0*   CBG: No results found for this basename: GLUCAP,  in the last 168 hours  Recent Results (from the past 240 hour(s))  MRSA PCR SCREENING     Status: None   Collection Time    12/21/12  2:34 AM      Result Value Range Status   MRSA by PCR NEGATIVE  NEGATIVE Final   Comment:            The GeneXpert MRSA Assay (FDA     approved for NASAL specimens     only), is one component of a     comprehensive MRSA colonization     surveillance program. It is not     intended to diagnose MRSA     infection nor to guide or     monitor treatment for     MRSA infections.     Studies: Dg Chest Port 1 View  12/24/2012   CLINICAL DATA:  Cough. Evaluate pleural effusion.  EXAM: PORTABLE CHEST - 1 VIEW  COMPARISON:  12/22/2012  FINDINGS: PICC line tip in the SVC region. Again noted is enlargement of the cardiac  silhouette. Again noted are hazy densities at lung bases which may represent effusions. Upper lungs remain clear.  IMPRESSION: Persistent hazy densities at the lung bases that probably represent pleural effusions. Minimal change from the previous examination.  Mild enlargement of the cardiac silhouette.   Electronically Signed   By: Richarda Overlie M.D.   On: 12/24/2012 08:04    Scheduled Meds: . amiodarone  200 mg Oral BID  . furosemide  80 mg Intravenous BID  . metoprolol tartrate  25 mg Oral BID  . Rivaroxaban  15 mg Oral BID WC  . sodium chloride  10-40 mL Intracatheter Q12H  . sodium chloride  3 mL Intravenous Q12H  . sodium chloride  3 mL Intravenous Q12H   Continuous Infusions:    Principal Problem:   Atrial fibrillation with rapid ventricular response, paroxysmal Active Problems:   Near syncope   Hypothyroid   History of DVT (deep vein thrombosis)   Mastitis, acute, possible inflammatory breast CA?   Acute systolic congestive heart failure   Pleural effusion, right   Hypokalemia   Hypomagnesemia    Time spent: 30 min    Urian Martenson, [facility]  [facility] Pager [phone]. If 7PM-7AM, please contact night-coverage at [email or web address], password [facility] 12/24/2012, 5:22 PM  LOS: 4 days

## 2012-12-25 LAB — CBC
HCT: 30.1 % — ABNORMAL LOW (ref 36.0–46.0)
MCV: 94.4 fL (ref 78.0–100.0)
Platelets: 170 10*3/uL (ref 150–400)
RBC: 3.19 MIL/uL — ABNORMAL LOW (ref 3.87–5.11)
RDW: 13.9 % (ref 11.5–15.5)
WBC: 5.3 10*3/uL (ref 4.0–10.5)

## 2012-12-25 LAB — BASIC METABOLIC PANEL
CO2: 31 mEq/L (ref 19–32)
Calcium: 9.3 mg/dL (ref 8.4–10.5)
Chloride: 96 mEq/L (ref 96–112)
Creatinine, Ser: 0.77 mg/dL (ref 0.50–1.10)
GFR calc Af Amer: >90 mL/min (ref >90)
Potassium: 3.2 mEq/L — ABNORMAL LOW (ref 3.5–5.1)
Sodium: 135 mEq/L (ref 135–145)

## 2012-12-25 LAB — MAGNESIUM: Magnesium: 1.7 mg/dL (ref 1.5–2.5)

## 2012-12-25 MED ORDER — METOPROLOL TARTRATE 25 MG PO TABS: 25.0000 mg | ORAL_TABLET | Freq: Two times a day (BID) | ORAL | Status: DC

## 2012-12-25 MED ORDER — FUROSEMIDE 40 MG PO TABS
40.0000 mg | ORAL_TABLET | Freq: Every day | ORAL | Status: DC
  Administered 2012-12-25: 12:00:00 40 mg via ORAL
  Filled 2012-12-25: qty 1

## 2012-12-25 MED ORDER — POTASSIUM CHLORIDE CRYS ER 20 MEQ PO TBCR: 20.0000 meq | EXTENDED_RELEASE_TABLET | Freq: Every day | ORAL | Status: DC

## 2012-12-25 MED ORDER — POTASSIUM CHLORIDE CRYS ER 20 MEQ PO TBCR
40.0000 meq | EXTENDED_RELEASE_TABLET | Freq: Once | ORAL | Status: AC
  Administered 2012-12-25: 40 meq via ORAL
  Filled 2012-12-25: qty 2

## 2012-12-25 MED ORDER — RIVAROXABAN 15 MG PO TABS: 15.0000 mg | ORAL_TABLET | Freq: Two times a day (BID) | ORAL | Status: DC

## 2012-12-25 MED ORDER — AMIODARONE HCL 200 MG PO TABS: 200.0000 mg | ORAL_TABLET | Freq: Two times a day (BID) | ORAL | Status: DC

## 2012-12-25 MED ORDER — FUROSEMIDE 40 MG PO TABS: 40.0000 mg | ORAL_TABLET | Freq: Every day | ORAL | Status: DC

## 2012-12-25 NOTE — Discharge Summary (Signed)
Physician Discharge Summary  Taylen Wendland ZOX:[id number] DOB: 11-13-1975 DOA: 12/20/2012  PCP: No primary provider on file.   Admit date: 12/20/2012 Discharge date: 12/25/2012  Recommendations for Outpatient Follow-up:  - Patient has scheduled appointment with new primary care doctor for Nov 13th.  Told to bring her paperwork from discharge with her.  Further evaluation of anemia as outpatient, including referral to GI if needed.  Hypercoagulable panel if not already complete given hx of DVT. -  PCP to refer for bilateral mammogram and skin biopsy of right breast -  Follow up with Dr. Derrell Lolling, General Surgery, after mammogram and biopsy are complete -  Follow up with cardiology within 1 week of discharge.  **Please complete XARELTO free Rx paperwork and give prescription for 20mg  daily to start after the initial 15mg  BID has ended.**  Check BMP and adjust dose of lasix and potassium as needed.  Discharge Diagnoses:  Principal Problem:   Atrial fibrillation with rapid ventricular response, paroxysmal Active Problems:   Near syncope   Hypothyroid   History of DVT (deep vein thrombosis)   Mastitis, acute, possible inflammatory breast CA?   Acute systolic congestive heart failure   Pleural effusion, right   Hypokalemia   Hypomagnesemia   Discharge Condition: stable, improved  Diet recommendation: healthy heart  Wt Readings from Last 3 Encounters:  12/25/12 106.9 kg (235 lb 10.8 oz)  12/25/12 106.9 kg (235 lb 10.8 oz)    History of present illness:  Jalani Cullifer is a 37 y.o. female with previous history of paroxysmal atrial fibrillation 2 years ago and history of DVT at the same time 2 years ago and has never had followup with cardiologist her primary care physician's presents to the year because of an onset of shortness of breath and palpitations last night. Patient progressively felt chest tightness and heaviness. In the ER patient was found to be in A. fib with RVR. Chest  x-ray showed congestion. Patient was started on Cardizem infusion despite which patient's heart rate still remained high and cardiologist on call Dr. Sharyn Lull was consulted and cardiologist recommended to start amiodarone and heparin infusion. Patient has been limited for further management. Patient otherwise denies any chest pain nausea vomiting abdominal pain diarrhea fever chills. CT angiogram of the chest was negative for PE.   Hospital Course:   Atrial fibrillation with rapid ventricular response, CHAD2vasc of 2-3 depending on whether she has unidentified HTN. She became hypotensive after starting diltiazem infusion and was transitioned to amiodarone.  Despite loading with amiodarone and continuous infusion and increasing doses of beta blocker, her HR remained in the 140s. She underwent successful direct current cardioversion on 10/23 and remained in NSR with rate in the 60s for more than 24 hours post procedure.  She was started on xarelto and was given card for first 3 weeks free, however, she will need to have the cardiology office assist her with the paperwork to continue her xarelto and she will need the prescription for 20mg  daily to accompany the paperwork.  She will continue amiodarone and metoprolol at discharge to be titrated as outpatient by cardiology.     Acute respiratory failure due to acute systolic heart failure and right pleural effusion.  ECHO demonstrated EF of 35-40% with diffuse hypokinesis, moderate MR, moderately increases PA peak pressure and moderate TR, small pericardial effusion.  She has bilateral lower extremity edema that appears acute on chronic and was diuresed with IV lasix 80mg  BID while inpatient.  Some of her heart  failure may improve after resolution of her a-fib with RVR.  Repeat ECHO per cardiology.  Continue oral lasix with potassium as outpatient.  Will defer initiation of ACEI to cardiology if blood pressure tolerates at follow up appointment this week.   -4.8L neg since admission.  She will start xarelto and beta blocker.   Wt Readings from Last 3 Encounters:  12/25/12 106.9 kg (235 lb 10.8 oz)  12/25/12 106.9 kg (235 lb 10.8 oz)    History of DVT, but duplex of RUE is negative.   Mastitis vs. Inflammatory breast cancer, symptoms have been present for a year and have not worsened in the last several weeks to days. Mastitis would be less likely.  She was started empirically on antibiotics initially, however, these were discontinued when there was no change in appearance over several days.  She was seen by general surgery who recommended outpatient skin biopsy, outpatient mammogram.  Her PCP can refer her.  She will need to follow up with Dr. Derrell Lolling after these are complete.    History of bipolar affective disorder and psychosis-past history not on any medications currently.  H/o MNG thyroid 2012, TFTs wnl.  Hypokalemia and hypomagnesemia, likely due to diuresis.  Continue low dose potassium at discharge.    Consultants:  Cardiology-Harwani  Gen surgery-Ingram Procedures:  See below Antibiotics:  Cefazolin 10/21   Discharge Exam: Filed Vitals:   12/25/12 0554  BP: 128/72  Pulse: 62  Temp: 98.2 F (36.8 C)  Resp: 16   Filed Vitals:   12/24/12 1119 12/24/12 2129 12/25/12 0500 12/25/12 0554  BP: 114/73 117/90  128/72  Pulse: 64 62  62  Temp: 98.2 F (36.8 C) 97.9 F (36.6 C)  98.2 F (36.8 C)  TempSrc: Oral Oral  Oral  Resp: 20 18  16   Height:      Weight:   106.9 kg (235 lb 10.8 oz)   SpO2: 98% 99%  99%    General: Adult AAF female, No acute distress  HEENT: NCAT, MMM  Cardiovascular: RRR, nl S1, S2 no mrg, 2+ pulses, warm extremities  Respiratory: Diminished at the bilateral bases, but no focal rales or rhonchi. Faint wheeze, no increased WOB  Abdomen: NABS, soft, NT/ND  MSK: Normal tone and bulk, 1+ bilateral LEE  Neuro: Grossly intact   Discharge Instructions      Discharge Orders   Future Appointments  Provider Department Dept Phone   01/13/2013 11:30 AM [facility] Covering Provider 2 [facility] [phone]   Future Orders Complete By Expires   (HEART FAILURE PATIENTS) Call MD:  Anytime you have any of the following symptoms: 1) 3 pound weight gain in 24 hours or 5 pounds in 1 week 2) shortness of breath, with or without a dry hacking cough 3) swelling in the hands, feet or stomach 4) if you have to sleep on extra pillows at night in order to breathe.  As directed    Call MD for:  difficulty breathing, headache or visual disturbances  As directed    Call MD for:  extreme fatigue  As directed    Call MD for:  hives  As directed    Call MD for:  persistant dizziness or light-headedness  As directed    Call MD for:  persistant nausea and vomiting  As directed    Call MD for:  severe uncontrolled pain  As directed    Call MD for:  temperature >100.4  As directed  Diet - low sodium heart healthy  As directed    Discharge instructions  As directed    Comments:     You were hospitalized with atrial fibrillation with rapid heart rate.  Despite trials of several medications to slow your heart rate or convert the rhythm to regular rhythm, your heart continued to beat fast.  You underwent electrical cardioversion to shock your heart into regular rhythm, which worked.  You are at increased risk of stroke because of your atrial fibrillation and recent cardioversion and you were started on xarelto.  Please take xarelto 15mg  twice a day for the first three weeks using the free medication card.  You will need to see your cardiologist for the prescription for xarelto and assistance with the paperwork to extend your free medication within 1 week so that you can get your medication in three weeks.  This is VERY important.  Your heart was not beating strongly on your ultrasound, which is called systolic heart failure.  This may be temporary because of your fast heart beat, but please read  through the information about heart failure carefully.  Eat a low salt diet, weigh yourself daily, and call the cardiology office if you develop swelling or weight gain of 3-lbs in one day or 5-lbs in one week.  Use amiodarone and metoprolol to help your heart rhythm and your heart failure.  For your breast, please have your primary care doctor obtain mammograms and a skin biopsy.  After these are complete, please follow up with Dr. Derrell Lolling.   Increase activity slowly  As directed        Medication List         amiodarone 200 MG tablet  Commonly known as:  PACERONE  Take 1 tablet (200 mg total) by mouth 2 (two) times daily.     furosemide 40 MG tablet  Commonly known as:  LASIX  Take 1 tablet (40 mg total) by mouth daily.     metoprolol tartrate 25 MG tablet  Commonly known as:  LOPRESSOR  Take 1 tablet (25 mg total) by mouth 2 (two) times daily.     potassium chloride SA 20 MEQ tablet  Commonly known as:  K-DUR,KLOR-CON  Take 1 tablet (20 mEq total) by mouth daily.     Rivaroxaban 15 MG Tabs tablet  Commonly known as:  XARELTO  Take 1 tablet (15 mg total) by mouth 2 (two) times daily with a meal.       Follow-up Information   Schedule an appointment as soon as possible for a visit with Ernestene Mention, MD. (Call for an appointment and schedule after mammogram and ultrasound study of the breast.)    Specialty:  General Surgery   Contact information:   [address] [place] [address] [phone]       Follow up with Robynn Pane, MD. Schedule an appointment as soon as possible for a visit in 1 week. (MUST be within 1 week to complete XARELTO paperwork.  )    Specialty:  Cardiology   Contact information:   [address] [place] [address] [phone]       Follow up with Primary care doctor. (already scheduled appointment.)        The results of significant diagnostics from this hospitalization (including imaging,  microbiology, ancillary and laboratory) are listed below for reference.    Significant Diagnostic Studies: Ct Angio Chest W/cm &/or Wo Cm  12/21/2012   *RADIOLOGY  REPORT*  Clinical Data: Shortness of breath, weakness, history of deep vein thrombosis and pulmonary embolism.  CT ANGIOGRAPHY CHEST  Technique:  Multidetector CT imaging of the chest using the standard protocol during bolus administration of intravenous contrast. Multiplanar reconstructed images including MIPs were obtained and reviewed to evaluate the vascular anatomy.  Contrast: OMNIPAQUE IOHEXOL 350 MG/ML SOLN  Comparison: CT angiogram of the chest August 17, 2010.  Findings: Main pulmonary artery is not enlarged.  No pulmonary arterial filling defects to the level of the subsegmental branches.  Moderate right and small left layering pleural effusions.  Mild interstitial prominence, with mild bronchial wall thickening, and patchy ground-glass opacities.  No pneumothorax. 3 mm right middle lobe subpleural nodule is likely benign.  The heart appears mild to moderately enlarged, with enlarged right atrium, and reflux of contrast into the liver.thoracic aorta is normal in course and caliber.  Small mediastinal lymph nodes may be reactive.  Diffuse mild anasarca.  Thyroid is difficult to chacterize though, appears enlarged withpunctate calcifications; thyroid ultrasound Jul 30, 2010 demonstrated multinodular goiter.  IMPRESSION: No evidence of pulmonary embolism to the level of subsegmental branches.  Cardiomegaly, enlarged right atrium suggesting congestive heart failure, with moderate right and small left pleural effusion. Vascular congestion with patchy ground-glass opacities and bronchial wall thickening most consistent with pulmonary edema. Mild anasarca.   Original Report Authenticated By: Awilda Metro   Dg Chest Port 1 View  12/24/2012   CLINICAL DATA:  Cough. Evaluate pleural effusion.  EXAM: PORTABLE CHEST - 1 VIEW  COMPARISON:   12/22/2012  FINDINGS: PICC line tip in the SVC region. Again noted is enlargement of the cardiac silhouette. Again noted are hazy densities at lung bases which may represent effusions. Upper lungs remain clear.  IMPRESSION: Persistent hazy densities at the lung bases that probably represent pleural effusions. Minimal change from the previous examination.  Mild enlargement of the cardiac silhouette.   Electronically Signed   By: Richarda Overlie M.D.   On: 12/24/2012 08:04   Dg Chest Port 1 View  12/22/2012   CLINICAL DATA:  Status post PICC line placement  EXAM: PORTABLE CHEST - 1 VIEW  COMPARISON:  10/20/ 14  FINDINGS: The cardiac shadow is stable. A right-sided PICC line is seen with the catheter tip in the mid superior vena cava. Increased density is noted on the right consistent with the known right-sided effusion.  IMPRESSION: Status post PICC line as described.   Electronically Signed   By: Alcide Clever M.D.   On: 12/22/2012 12:15   Dg Chest Portable 1 View  12/20/2012   CLINICAL DATA:  Shortness of breath and cough.  EXAM: PORTABLE CHEST - 1 VIEW  COMPARISON:  05/11/2012  FINDINGS: The heart size and mediastinal contours are within normal limits. Both lungs are clear. The visualized skeletal structures are unremarkable.  IMPRESSION: No active disease.   Electronically Signed   By: Burman Nieves M.D.   On: 12/20/2012 22:55    Microbiology: Recent Results (from the past 240 hour(s))  MRSA PCR SCREENING     Status: None   Collection Time    12/21/12  2:34 AM      Result Value Range Status   MRSA by PCR NEGATIVE  NEGATIVE Final   Comment:            The GeneXpert MRSA Assay (FDA     approved for NASAL specimens     only), is one component of a  comprehensive MRSA colonization     surveillance program. It is not     intended to diagnose MRSA     infection nor to guide or     monitor treatment for     MRSA infections.     Labs: Basic Metabolic Panel:  Recent Labs Lab 12/20/12 2240  12/21/12 0400 12/23/12 0340 12/24/12 0540 12/25/12 0647  NA 136 133* 132* 132* 135  K 4.1 3.8 3.2* 3.2* 3.2*  CL 101 99 97 95* 96  CO2 21 20 27 28 31   GLUCOSE 113* 136* 94 122* 81  BUN 11 12 14 11 10   CREATININE 0.82 0.84 1.12* 0.89 0.77  CALCIUM 9.5 9.6 8.8 8.8 9.3  MG  --   --  1.5 1.9 1.7   Liver Function Tests:  Recent Labs Lab 12/21/12 0400  AST 28  ALT 31  ALKPHOS 67  BILITOT 1.1  PROT 7.0  ALBUMIN 3.7   No results found for this basename: LIPASE, AMYLASE,  in the last 168 hours No results found for this basename: AMMONIA,  in the last 168 hours CBC:  Recent Labs Lab 12/21/12 0400 12/22/12 1300 12/23/12 0340 12/24/12 0540 12/25/12 0647  WBC 6.9 6.7 5.8 5.7 5.3  NEUTROABS 4.8  --   --   --   --   HGB 10.4* 10.1* 9.7* 9.8* 9.7*  HCT 30.9* 30.0* 29.6* 29.4* 30.1*  MCV 91.7 93.8 94.3 93.9 94.4  PLT 229 197 179 184 170   Cardiac Enzymes:  Recent Labs Lab 12/21/12 0400 12/21/12 1009 12/21/12 1434  TROPONINI <0.30 <0.30 <0.30   BNP: BNP (last 3 results)  Recent Labs  05/11/12 0801 12/20/12 2240 12/24/12 0540  PROBNP 324.7* 2844.0* 2257.0*   CBG: No results found for this basename: GLUCAP,  in the last 168 hours  Time coordinating discharge: 45 minutes  Signed:  [place]  [facility] 12/25/2012, 9:30 AM

## 2012-12-25 NOTE — Progress Notes (Signed)
12/25/2012 1400 NCM contacted pt and left message on voicemail. Isidoro Donning RN CCM Case Mgmt phone [phone]

## 2013-01-08 ENCOUNTER — Inpatient Hospital Stay (HOSPITAL_COMMUNITY)
Admission: EM | Admit: 2013-01-08 | Discharge: 2013-01-11 | DRG: 308 | Disposition: A | Discharge status: Home / Self Care | Payer: Medicaid Other | Attending: Internal Medicine | Admitting: Internal Medicine

## 2013-01-08 ENCOUNTER — Encounter (HOSPITAL_COMMUNITY): Payer: Self-pay | Admitting: Emergency Medicine

## 2013-01-08 DIAGNOSIS — I4891 Unspecified atrial fibrillation: Secondary | ICD-10-CM

## 2013-01-08 DIAGNOSIS — E662 Morbid (severe) obesity with alveolar hypoventilation: Secondary | ICD-10-CM | POA: Diagnosis present

## 2013-01-08 DIAGNOSIS — I5023 Acute on chronic systolic (congestive) heart failure: Secondary | ICD-10-CM | POA: Diagnosis present

## 2013-01-08 DIAGNOSIS — Z91199 Patient's noncompliance with other medical treatment and regimen due to unspecified reason: Secondary | ICD-10-CM

## 2013-01-08 DIAGNOSIS — E039 Hypothyroidism, unspecified: Secondary | ICD-10-CM | POA: Diagnosis present

## 2013-01-08 DIAGNOSIS — Z9119 Patient's noncompliance with other medical treatment and regimen: Secondary | ICD-10-CM

## 2013-01-08 DIAGNOSIS — I959 Hypotension, unspecified: Secondary | ICD-10-CM | POA: Diagnosis present

## 2013-01-08 DIAGNOSIS — T502X5A Adverse effect of carbonic-anhydrase inhibitors, benzothiadiazides and other diuretics, initial encounter: Secondary | ICD-10-CM | POA: Diagnosis present

## 2013-01-08 DIAGNOSIS — I5021 Acute systolic (congestive) heart failure: Secondary | ICD-10-CM | POA: Diagnosis present

## 2013-01-08 DIAGNOSIS — I10 Essential (primary) hypertension: Secondary | ICD-10-CM | POA: Diagnosis present

## 2013-01-08 DIAGNOSIS — D649 Anemia, unspecified: Secondary | ICD-10-CM | POA: Diagnosis present

## 2013-01-08 DIAGNOSIS — Z86711 Personal history of pulmonary embolism: Secondary | ICD-10-CM

## 2013-01-08 DIAGNOSIS — Z23 Encounter for immunization: Secondary | ICD-10-CM

## 2013-01-08 DIAGNOSIS — J811 Chronic pulmonary edema: Secondary | ICD-10-CM

## 2013-01-08 DIAGNOSIS — Z87891 Personal history of nicotine dependence: Secondary | ICD-10-CM

## 2013-01-08 DIAGNOSIS — Z8249 Family history of ischemic heart disease and other diseases of the circulatory system: Secondary | ICD-10-CM

## 2013-01-08 DIAGNOSIS — N179 Acute kidney failure, unspecified: Secondary | ICD-10-CM | POA: Diagnosis present

## 2013-01-08 DIAGNOSIS — Z86718 Personal history of other venous thrombosis and embolism: Secondary | ICD-10-CM

## 2013-01-08 DIAGNOSIS — F319 Bipolar disorder, unspecified: Secondary | ICD-10-CM | POA: Diagnosis present

## 2013-01-08 DIAGNOSIS — I509 Heart failure, unspecified: Secondary | ICD-10-CM | POA: Diagnosis present

## 2013-01-08 DIAGNOSIS — F200 Paranoid schizophrenia: Secondary | ICD-10-CM | POA: Diagnosis present

## 2013-01-08 HISTORY — DX: Hypothyroidism, unspecified: E03.9

## 2013-01-08 HISTORY — DX: Anemia, unspecified: D64.9

## 2013-01-08 LAB — CBC WITH DIFFERENTIAL/PLATELET
Basophils Relative: 1 % (ref 0–1)
Eosinophils Absolute: 0 10*3/uL (ref 0.0–0.7)
Lymphs Abs: 2.3 10*3/uL (ref 0.7–4.0)
MCH: 31.9 pg (ref 26.0–34.0)
MCHC: 34.6 g/dL (ref 30.0–36.0)
Neutro Abs: 3.8 10*3/uL (ref 1.7–7.7)
Neutrophils Relative %: 58 % (ref 43–77)
Platelets: 259 10*3/uL (ref 150–400)
RBC: 3.17 MIL/uL — ABNORMAL LOW (ref 3.87–5.11)

## 2013-01-08 LAB — PROTIME-INR: INR: 1.47 (ref 0.00–1.49)

## 2013-01-08 LAB — COMPREHENSIVE METABOLIC PANEL
ALT: 18 U/L (ref 0–35)
AST: 22 U/L (ref 0–37)
Albumin: 3.6 g/dL (ref 3.5–5.2)
Alkaline Phosphatase: 74 U/L (ref 39–117)
CO2: 21 mEq/L (ref 19–32)
Chloride: 95 mEq/L — ABNORMAL LOW (ref 96–112)
GFR calc non Af Amer: 80 mL/min — ABNORMAL LOW (ref >90)
Glucose, Bld: 106 mg/dL — ABNORMAL HIGH (ref 70–99)
Potassium: 3.9 mEq/L (ref 3.5–5.1)
Sodium: 130 mEq/L — ABNORMAL LOW (ref 135–145)
Total Protein: 7.3 g/dL (ref 6.0–8.3)

## 2013-01-08 LAB — PRO B NATRIURETIC PEPTIDE: Pro B Natriuretic peptide (BNP): 2886 pg/mL — ABNORMAL HIGH (ref 0–125)

## 2013-01-08 LAB — POCT I-STAT TROPONIN I

## 2013-01-08 MED ORDER — DILTIAZEM HCL 100 MG IV SOLR
5.0000 mg/h | INTRAVENOUS | Status: DC
  Administered 2013-01-08: 5 mg/h via INTRAVENOUS
  Filled 2013-01-08 (×2): qty 100

## 2013-01-08 MED ORDER — DILTIAZEM HCL 25 MG/5ML IV SOLN
15.0000 mg | Freq: Once | INTRAVENOUS | Status: AC
  Administered 2013-01-08: 15 mg via INTRAVENOUS
  Filled 2013-01-08: qty 5

## 2013-01-08 MED ORDER — FUROSEMIDE 10 MG/ML IJ SOLN
40.0000 mg | Freq: Once | INTRAMUSCULAR | Status: AC
  Administered 2013-01-08: 40 mg via INTRAVENOUS
  Filled 2013-01-08: qty 4

## 2013-01-08 NOTE — ED Notes (Addendum)
Per ems-- pt found to be in afib at a rate of 200+. ems administered 30mg  of Cardizem total. Pt was just at [place] last week and was cardioverted. Pt aggressive. Rate down to 160's after Cardizem. Pt denies drug or alcohol use.

## 2013-01-08 NOTE — ED Notes (Signed)
Pt CBG 103 mg/dl

## 2013-01-08 NOTE — ED Notes (Signed)
Md at bedside

## 2013-01-09 ENCOUNTER — Encounter (HOSPITAL_COMMUNITY): Payer: Self-pay

## 2013-01-09 ENCOUNTER — Other Ambulatory Visit: Payer: Self-pay

## 2013-01-09 ENCOUNTER — Emergency Department (HOSPITAL_COMMUNITY): Payer: Medicaid Other

## 2013-01-09 DIAGNOSIS — I4891 Unspecified atrial fibrillation: Secondary | ICD-10-CM

## 2013-01-09 DIAGNOSIS — I5021 Acute systolic (congestive) heart failure: Secondary | ICD-10-CM

## 2013-01-09 DIAGNOSIS — I509 Heart failure, unspecified: Secondary | ICD-10-CM

## 2013-01-09 DIAGNOSIS — E039 Hypothyroidism, unspecified: Secondary | ICD-10-CM

## 2013-01-09 LAB — TROPONIN I
Troponin I: <0.3 ng/mL (ref <0.30)
Troponin I: <0.3 ng/mL (ref <0.30)

## 2013-01-09 LAB — CBC
HCT: 28.9 % — ABNORMAL LOW (ref 36.0–46.0)
MCV: 92.6 fL (ref 78.0–100.0)
RBC: 3.12 MIL/uL — ABNORMAL LOW (ref 3.87–5.11)
RDW: 15 % (ref 11.5–15.5)
WBC: 6.9 10*3/uL (ref 4.0–10.5)

## 2013-01-09 LAB — POCT I-STAT TROPONIN I: Troponin i, poc: 0.01 ng/mL (ref 0.00–0.08)

## 2013-01-09 LAB — COMPREHENSIVE METABOLIC PANEL
Albumin: 3.5 g/dL (ref 3.5–5.2)
BUN: 14 mg/dL (ref 6–23)
CO2: 21 mEq/L (ref 19–32)
Calcium: 9.1 mg/dL (ref 8.4–10.5)
Chloride: 98 mEq/L (ref 96–112)
Creatinine, Ser: 0.97 mg/dL (ref 0.50–1.10)
GFR calc Af Amer: 84 mL/min — ABNORMAL LOW (ref >90)
Potassium: 4.3 mEq/L (ref 3.5–5.1)
Sodium: 134 mEq/L — ABNORMAL LOW (ref 135–145)
Total Protein: 7.4 g/dL (ref 6.0–8.3)

## 2013-01-09 LAB — GLUCOSE, CAPILLARY: Glucose-Capillary: 103 mg/dL — ABNORMAL HIGH (ref 70–99)

## 2013-01-09 MED ORDER — AMIODARONE IV BOLUS ONLY 150 MG/100ML: 150.0000 mg | Freq: Once | INTRAVENOUS | Status: DC

## 2013-01-09 MED ORDER — SODIUM CHLORIDE 0.9 % IJ SOLN
3.0000 mL | Freq: Two times a day (BID) | INTRAMUSCULAR | Status: DC
  Administered 2013-01-09 – 2013-01-10 (×3): 3 mL via INTRAVENOUS

## 2013-01-09 MED ORDER — PNEUMOCOCCAL VAC POLYVALENT 25 MCG/0.5ML IJ INJ
0.5000 mL | INJECTION | INTRAMUSCULAR | Status: AC
  Administered 2013-01-10: 0.5 mL via INTRAMUSCULAR
  Filled 2013-01-09: qty 0.5

## 2013-01-09 MED ORDER — METOPROLOL TARTRATE 50 MG PO TABS
50.0000 mg | ORAL_TABLET | Freq: Two times a day (BID) | ORAL | Status: DC
  Administered 2013-01-09 – 2013-01-10 (×3): 50 mg via ORAL
  Filled 2013-01-09 (×5): qty 1

## 2013-01-09 MED ORDER — ALBUTEROL SULFATE (5 MG/ML) 0.5% IN NEBU: 2.5000 mg | INHALATION_SOLUTION | RESPIRATORY_TRACT | Status: DC | PRN

## 2013-01-09 MED ORDER — AMIODARONE HCL IN DEXTROSE 360-4.14 MG/200ML-% IV SOLN
60.0000 mg/h | INTRAVENOUS | Status: AC
  Administered 2013-01-09 (×2): 60 mg/h via INTRAVENOUS
  Filled 2013-01-09 (×2): qty 200

## 2013-01-09 MED ORDER — FUROSEMIDE 10 MG/ML IJ SOLN
40.0000 mg | Freq: Once | INTRAMUSCULAR | Status: DC
  Filled 2013-01-09: qty 4

## 2013-01-09 MED ORDER — METOPROLOL TARTRATE 1 MG/ML IV SOLN
5.0000 mg | Freq: Once | INTRAVENOUS | Status: AC
  Administered 2013-01-09: 5 mg via INTRAVENOUS
  Filled 2013-01-09: qty 5

## 2013-01-09 MED ORDER — FUROSEMIDE 10 MG/ML IJ SOLN
40.0000 mg | Freq: Every day | INTRAMUSCULAR | Status: DC
  Administered 2013-01-09: 40 mg via INTRAVENOUS
  Filled 2013-01-09: qty 4

## 2013-01-09 MED ORDER — AMIODARONE LOAD VIA INFUSION
150.0000 mg | Freq: Once | INTRAVENOUS | Status: AC
  Administered 2013-01-09: 150 mg via INTRAVENOUS
  Filled 2013-01-09: qty 83.34

## 2013-01-09 MED ORDER — ENOXAPARIN SODIUM 120 MG/0.8ML ~~LOC~~ SOLN
1.0000 mg/kg | Freq: Two times a day (BID) | SUBCUTANEOUS | Status: DC
  Administered 2013-01-09 – 2013-01-10 (×2): 105 mg via SUBCUTANEOUS
  Filled 2013-01-09 (×4): qty 0.8

## 2013-01-09 MED ORDER — AMIODARONE HCL IN DEXTROSE 360-4.14 MG/200ML-% IV SOLN
30.0000 mg/h | INTRAVENOUS | Status: DC
  Administered 2013-01-09 – 2013-01-10 (×3): 30 mg/h via INTRAVENOUS
  Filled 2013-01-09 (×9): qty 200

## 2013-01-09 MED ORDER — DEXTROSE 5 % IV SOLN: 60.0000 mg/h | Freq: Once | INTRAVENOUS | Status: DC

## 2013-01-09 MED ORDER — ENOXAPARIN SODIUM 120 MG/0.8ML ~~LOC~~ SOLN
110.0000 mg | Freq: Once | SUBCUTANEOUS | Status: AC
  Administered 2013-01-09: 110 mg via SUBCUTANEOUS
  Filled 2013-01-09: qty 0.8

## 2013-01-09 MED ORDER — DILTIAZEM HCL ER COATED BEADS 240 MG PO CP24
240.0000 mg | ORAL_CAPSULE | Freq: Every day | ORAL | Status: DC
  Administered 2013-01-09 – 2013-01-11 (×3): 240 mg via ORAL
  Filled 2013-01-09 (×3): qty 1

## 2013-01-09 MED ORDER — HYDROCODONE-ACETAMINOPHEN 5-325 MG PO TABS
1.0000 | ORAL_TABLET | Freq: Four times a day (QID) | ORAL | Status: DC | PRN
  Administered 2013-01-09: 1 via ORAL
  Filled 2013-01-09: qty 1

## 2013-01-09 NOTE — Consult Note (Signed)
Ref: [facility]: M. Harwani Ashley Jones is an 37 y.o. female.   Chief Complaint: Palpitation, shortness of breath,chest pressure and medication non-compliance. HPI: 37 y.o. female with Past medical history of atrial fibrillation, history of DVT, hypothyroidism presented with the complaint of shortness of breath, dizziness and chest pressure that has been ongoing since last few days. She was recently hospitalized for A. fib with RVR and underwent a cardioversion. She mentions that she has not refilled her medications and is not taking any medications for the discharge. No chest pain now. She also has chronic leg edema.   Past Medical History  Diagnosis Date  . Hypertension   . Atrial fibrillation   . Lightheadedness   . Chest pain   . SOB (shortness of breath)   . Diaphoresis   . Nauseated   . Bipolar 1 disorder   . History of paranoid schizophrenia   . Multinodular goiter     BY THYROID ULTRASOUND 07/30/2010  . Hypothyroidism   . Anemia       Past Surgical History  Procedure Laterality Date  . Transthoracic echocardiogram  07/30/2010    Left ventricle: The cavity size was normal. Wall thickness was increased in a pattern of mild LVH. Systolic function was normal.  The estimated ejection fraction was in the range of 60% to 65%.  Rhae Hammock without cardioversion N/A 12/23/2012    Procedure: TRANSESOPHAGEAL ECHOCARDIOGRAM (TEE);  Surgeon: Ricki Rodriguez, MD;  Location: [facility] ENDOSCOPY;  Service: Cardiovascular;  Laterality: N/A;  INPATIENT AT [place] WILL RETURN TO [place] PER DOCTOR  BM   . Cardioversion N/A 12/23/2012    Procedure: CARDIOVERSION;  Surgeon: Ricki Rodriguez, MD;  Location: [facility] ENDOSCOPY;  Service: Cardiovascular;  Laterality: N/A;    Family History  Problem Relation Age of Onset  . Heart attack Mother   . Hypertension Mother    Social History:  reports that she quit smoking about 18 years ago. She does not have any smokeless tobacco history on  file. She reports that she does not drink alcohol or use illicit drugs.  Allergies:  Allergies  Allergen Reactions  . Tetracyclines & Related Hives    No prescriptions prior to admission    Results for orders placed during the hospital encounter of 01/08/13 (from the past 48 hour(s))  PRO B NATRIURETIC PEPTIDE     Status: Abnormal   Collection Time    01/08/13 11:00 PM      Result Value Range   Pro B Natriuretic peptide (BNP) 2886.0 (*) 0 - 125 pg/mL  CBC WITH DIFFERENTIAL     Status: Abnormal   Collection Time    01/08/13 11:00 PM      Result Value Range   WBC 6.6  4.0 - 10.5 K/uL   RBC 3.17 (*) 3.87 - 5.11 MIL/uL   Hemoglobin 10.1 (*) 12.0 - 15.0 g/dL   HCT 09.6 (*) 04.5 - 40.9 %   MCV 92.1  78.0 - 100.0 fL   MCH 31.9  26.0 - 34.0 pg   MCHC 34.6  30.0 - 36.0 g/dL   RDW 81.1  91.4 - 78.2 %   Platelets 259  150 - 400 K/uL   Neutrophils Relative % 58  43 - 77 %   Neutro Abs 3.8  1.7 - 7.7 K/uL   Lymphocytes Relative 35  12 - 46 %   Lymphs Abs 2.3  0.7 - 4.0 K/uL   Monocytes Relative 7  3 - 12 %  Monocytes Absolute 0.5  0.1 - 1.0 K/uL   Eosinophils Relative 0  0 - 5 %   Eosinophils Absolute 0.0  0.0 - 0.7 K/uL   Basophils Relative 1  0 - 1 %   Basophils Absolute 0.0  0.0 - 0.1 K/uL  COMPREHENSIVE METABOLIC PANEL     Status: Abnormal   Collection Time    01/08/13 11:00 PM      Result Value Range   Sodium 130 (*) 135 - 145 mEq/L   Potassium 3.9  3.5 - 5.1 mEq/L   Chloride 95 (*) 96 - 112 mEq/L   CO2 21  19 - 32 mEq/L   Glucose, Bld 106 (*) 70 - 99 mg/dL   BUN 13  6 - 23 mg/dL   Creatinine, Ser 5.78  0.50 - 1.10 mg/dL   Calcium 9.3  8.4 - 46.9 mg/dL   Total Protein 7.3  6.0 - 8.3 g/dL   Albumin 3.6  3.5 - 5.2 g/dL   AST 22  0 - 37 U/L   ALT 18  0 - 35 U/L   Alkaline Phosphatase 74  39 - 117 U/L   Total Bilirubin 0.9  0.3 - 1.2 mg/dL   GFR calc non Af Amer 80 (*) >90 mL/min   GFR calc Af Amer >90  >90 mL/min   Comment: (NOTE)     The eGFR has been calculated  using the CKD EPI equation.     This calculation has not been validated in all clinical situations.     eGFR's persistently <90 mL/min signify possible Chronic Kidney     Disease.  PROTIME-INR     Status: Abnormal   Collection Time    01/08/13 11:00 PM      Result Value Range   Prothrombin Time 17.4 (*) 11.6 - 15.2 seconds   INR 1.47  0.00 - 1.49  POCT I-STAT TROPONIN I     Status: None   Collection Time    01/08/13 11:21 PM      Result Value Range   Troponin i, poc 0.01  0.00 - 0.08 ng/mL   Comment 3            Comment: Due to the release kinetics of cTnI,     a negative result within the first hours     of the onset of symptoms does not rule out     myocardial infarction with certainty.     If myocardial infarction is still suspected,     repeat the test at appropriate intervals.  GLUCOSE, CAPILLARY     Status: Abnormal   Collection Time    01/08/13 11:55 PM      Result Value Range   Glucose-Capillary 103 (*) 70 - 99 mg/dL  POCT I-STAT TROPONIN I     Status: None   Collection Time    01/09/13  2:26 AM      Result Value Range   Troponin i, poc 0.01  0.00 - 0.08 ng/mL   Comment 3            Comment: Due to the release kinetics of cTnI,     a negative result within the first hours     of the onset of symptoms does not rule out     myocardial infarction with certainty.     If myocardial infarction is still suspected,     repeat the test at appropriate intervals.  CBC     Status: Abnormal   Collection  Time    01/09/13  9:25 AM      Result Value Range   WBC 6.9  4.0 - 10.5 K/uL   RBC 3.12 (*) 3.87 - 5.11 MIL/uL   Hemoglobin 9.9 (*) 12.0 - 15.0 g/dL   HCT 45.4 (*) 09.8 - 11.9 %   MCV 92.6  78.0 - 100.0 fL   MCH 31.7  26.0 - 34.0 pg   MCHC 34.3  30.0 - 36.0 g/dL   RDW 14.7  82.9 - 56.2 %   Platelets 245  150 - 400 K/uL   Dg Chest 2 View  01/09/2013   CLINICAL DATA:  cough and chest pain  EXAM: CHEST  2 VIEW  COMPARISON:  12/24/2012  FINDINGS: The heart size and  mediastinal contours are within normal limits. Diffuse bilateral hazy lung opacities are identified. There is mild pleural fluid identified involving the left lung. The visualized skeletal structures are unremarkable.  IMPRESSION: 1. Suspect mild CHF.   Electronically Signed   By: Signa Kell M.D.   On: 01/09/2013 01:58    ROS Constitutional: Negative for fever and chills.  Eyes: Negative for blurred vision and double vision.  Respiratory: Positive for cough. Negative for hemoptysis, sputum production and shortness of breath.  Cardiovascular: Positive for palpitations and leg swelling. Negative for chest pain, orthopnea and claudication.  Gastrointestinal: Negative for nausea, vomiting and abdominal pain.  Genitourinary: Negative for dysuria.  Neurological: Positive for dizziness. Negative for headaches.   Blood pressure 122/99, pulse 146, temperature 98.7 F (37.1 C), temperature source Oral, resp. rate 13, weight 103.8 kg (228 lb 13.4 oz), last menstrual period 12/17/2012, SpO2 95.00%. Physical exam: Constitutional: No distress. HENT: Normocephalic and atraumatic, Brown eyes, Conjunctivae are normal. Pupils are equal, round, and reactive to light. No scleral icterus.  Neck: Normal range of motion. Neck supple. JVD present. No thyromegaly present.  Cardiovascular: Irregularly irregular, tachycardic, S1-S2 soft. II/VI systolic murmur. Respiratory: Clear, bilateral GI: Soft. Bowel sounds are normal. There is no tenderness. There is no rebound.  Musculoskeletal: No clubbing cyanosis 2 + edema, bilateral lower extremities   Neurological: She is alert and oriented to person, place, and time.   Assessment/Plan A.fibrillation with RVR Uncontrolled hypertension  Mild decompensated congestive heart failure   Hypothyroidism  History of multinodular goiter  Remote tobacco abuse  History of DVT  History of paranoid schizophrenia  Morbid obesity  Probable obstructive sleep apnea/obesity  hypoventilation syndrome  Anemia etiology unclear Medication non-compliance  Plan Add diltiazem and B-blocker for heart rate control. Agree with diuresis and r/o MI.  Glenna Brunkow S 01/09/2013, 10:05 AM

## 2013-01-09 NOTE — Progress Notes (Signed)
Pt.arrived to the unit around 0400. She was transported by stretcher with continuous IV infusion of Amiodorone and was on oxygen at 2 L [place]. She was able to help with transfer from stretcher to bed. She is A/Ox4 and has no c/o pain and no signs of respiratory distress. She is currently NPO except for ice chips and sips of water with medications. Will continue to monitor.

## 2013-01-09 NOTE — Progress Notes (Signed)
Pt.began to c/o non-radiating chest pain and heaviness 8/10 around 0600 so VS were taken and EKG was done. MD on call Dr.Patel was paged and notified. New orders were written. Pt.was given metoprolol IV.  Reported to oncoming RN.

## 2013-01-09 NOTE — Progress Notes (Signed)
[facility] PROGRESS NOTE  Ashley Jones ZOX:[id number] DOB: 06/28/1975 DOA: 01/08/2013 PCP: No PCP Per Patient  Assessment/Plan: 1. Atrial Fibrillation with RVR. Unfortunately patient did not have her prescriptions filled do to financial difficulties. Given her recent hospitalization where medical therapy did not control rates, ultimately requiring cardioversion, I have consulted cardiology. Previous workup has included a transthoracic echocardiogram performed on 12/20/2012 showing ejection fraction of 45-50% with diffuse hypokinesis. 2. Acute on chronic systolic congestive heart failure. Transthoracic echocardiogram showed an ejection fraction of 45-50%. Chest x-ray showing diffuse bilateral hazy lung opacities consistent with CHF. On exam she has 1-2+ bilateral extremity pitting edema. Will continue IV Lasix at 40 mg daily. Await further recommendations from cardiology. 3. Anticoagulation. Will continue Lovenox 1 mg per kilogram body weight.  4. Chest Pain. She has had 2 negative sets of troponins thus far. Be secondary to A. fib and CHF. Cardiology consultation 5. History of DVT. Continue anticoagulation 6. Hypothyroidism. Thyroid studies done on 12/21/2012 at which time she had a TSH of 0.81 with free T4-1 0.6 and free T3 of 3.0  Code Status: Full Code Disposition Plan: Cardiology Consultation   Consultants:  Cardiology   HPI/Subjective: Patient is a pleasant 37 year old female with a past medical history of atrial fibrillation, recently discharged from [facility] hospital on 12/25/2012 at which time patient presented with atrial fibrillation with rapid ventricular response. Despite receiving amiodarone and beta blocker therapy, she remained in A. fib with RVR. Cardiology was consulted as she underwent successful direct current cardioversion on 12/23/2012. She was discharged in stable condition. Unfortunately she will did not fill her prescriptions, stating having financial  difficulties and 40 minutes. Presented overnight with age or fibrillation with RVR. Presently on amiodarone, with heart rates maintaining in the 130s. I have consulted cardiology. She also complains of chest tightness this morning as well as nausea, cardiac enzymes have remained negative x2 sets.  Objective: Filed Vitals:   01/09/13 0602  BP: 122/99  Pulse:   Temp:   Resp:     Intake/Output Summary (Last 24 hours) at 01/09/13 0811 Last data filed at 01/09/13 0706  Gross per 24 hour  Intake 279.98 ml  Output    350 ml  Net -70.02 ml   There were no vitals filed for this visit.  Exam:   General:  Patient reporting chest tightness, no acute distress.   Cardiovascular: Tachycardic, irregular rate and rhythm, having 2 + bilateral pitting edema.   Respiratory: Bibalisar crackles, scattered rales, normal inspiratory effort, not on supplemental oxygen.  Abdomen: Soft, nontender, nondistended  Musculoskeletal: She has 2 + bilateral lower extremity edema.   Data Reviewed: Basic Metabolic Panel:  Recent Labs Lab 01/08/13 2300  NA 130*  K 3.9  CL 95*  CO2 21  GLUCOSE 106*  BUN 13  CREATININE 0.90  CALCIUM 9.3   Liver Function Tests:  Recent Labs Lab 01/08/13 2300  AST 22  ALT 18  ALKPHOS 74  BILITOT 0.9  PROT 7.3  ALBUMIN 3.6   No results found for this basename: LIPASE, AMYLASE,  in the last 168 hours No results found for this basename: AMMONIA,  in the last 168 hours CBC:  Recent Labs Lab 01/08/13 2300  WBC 6.6  NEUTROABS 3.8  HGB 10.1*  HCT 29.2*  MCV 92.1  PLT 259   Cardiac Enzymes: No results found for this basename: CKTOTAL, CKMB, CKMBINDEX, TROPONINI,  in the last 168 hours BNP (last 3 results)  Recent Labs  12/20/12 2240 12/24/12  0540 01/08/13 2300  PROBNP 2844.0* 2257.0* 2886.0*   CBG:  Recent Labs Lab 01/08/13 2355  GLUCAP 103*    No results found for this or any previous visit (from the past 240 hour(s)).   Studies: Dg  Chest 2 View  01/09/2013   CLINICAL DATA:  cough and chest pain  EXAM: CHEST  2 VIEW  COMPARISON:  12/24/2012  FINDINGS: The heart size and mediastinal contours are within normal limits. Diffuse bilateral hazy lung opacities are identified. There is mild pleural fluid identified involving the left lung. The visualized skeletal structures are unremarkable.  IMPRESSION: 1. Suspect mild CHF.   Electronically Signed   By: Signa Kell M.D.   On: 01/09/2013 01:58    Scheduled Meds: . enoxaparin (LOVENOX) injection  1 mg/kg Subcutaneous Q12H  . furosemide  40 mg Intravenous Daily  . [START ON 01/10/2013] pneumococcal 23 valent vaccine  0.5 mL Intramuscular Tomorrow-1000  . sodium chloride  3 mL Intravenous Q12H   Continuous Infusions: . amiodarone (NEXTERONE PREMIX) 360 mg/200 mL dextrose 60 mg/hr (01/09/13 0706)   Followed by  . amiodarone (NEXTERONE PREMIX) 360 mg/200 mL dextrose      Principal Problem:   Atrial fibrillation with rapid ventricular response, paroxysmal Active Problems:   Hypothyroid   History of DVT (deep vein thrombosis)   Acute systolic congestive heart failure    Time spent: 35 minutes    Jeralyn Bennett  [facility] Pager [phone]. If 7PM-7AM, please contact night-coverage at [email or web address], password [facility] 01/09/2013, 8:11 AM  LOS: 1 day

## 2013-01-09 NOTE — ED Notes (Signed)
Verbal order from Dr. Lavella Lemons to discontinue Cardizem.

## 2013-01-09 NOTE — Progress Notes (Signed)
Dr Algie Coffer made aware of patient HR sustaining in 120s-130s. BP 90s-low100s. No new orders at this time. Will continue to monitor patient closely. Levonne Spiller, RN

## 2013-01-09 NOTE — Progress Notes (Signed)
Called to see patient in consult for atrial fibrillation. Patient belongs to Dr. Algie Coffer and was recently cardioverted on 12/20/2012. I have notified Dr. Algie Coffer who will see the patient today.

## 2013-01-09 NOTE — Progress Notes (Addendum)
During admission report was told by ED nurse that patient's Cardizem had been discontinued per physician and that it was stopped at 0350. Looked at patient's [facility] seen where it was documented that verbal order was given to ED nurse to discontinue Cardizem IV by Dr.Manly. Called ED nurse to ask if she could update this under manage orders also.

## 2013-01-09 NOTE — ED Provider Notes (Addendum)
CSN: [id number]     Arrival date & time 01/08/13  2241 History   First MD Initiated Contact with Patient 01/08/13 2310     Chief Complaint  Patient presents with  . Atrial Fibrillation   (Consider location/radiation/quality/duration/timing/severity/associated sxs/prior Treatment) HPI Patient is a middle-aged woman with a history of recent admission for atrial fibrillation with rapid ventricular response which was refractory to rate limiting medications and ultimately required electro- cardioversion. The patient presents again today with complaints of rapid palpitations, shortness of breath.   She has not filled any of the prescriptions with which she was discharged from the hospital just a couple weeks ago. She says she has not had time. She denies recreational drug use, alcohol use and tobacco use.  She denies chest pain at this time. However, she has had intermittent chest tightness. Chest tightness just began today. Shortness of breath this began today. Shortness of breath is worse with any exertion. Palpitations and irregular heart rate began 3 days ago.  Past Medical History  Diagnosis Date  . Hypertension   . Atrial fibrillation   . Lightheadedness   . Chest pain   . SOB (shortness of breath)   . Diaphoresis   . Nauseated   . Bipolar 1 disorder   . History of paranoid schizophrenia   . Multinodular goiter     BY THYROID ULTRASOUND 07/30/2010   Past Surgical History  Procedure Laterality Date  . Transthoracic echocardiogram  07/30/2010    Left ventricle: The cavity size was normal. Wall thickness was increased in a pattern of mild LVH. Systolic function was normal.  The estimated ejection fraction was in the range of 60% to 65%.  Rhae Hammock without cardioversion N/A 12/23/2012    Procedure: TRANSESOPHAGEAL ECHOCARDIOGRAM (TEE);  Surgeon: Ricki Rodriguez, MD;  Location: [facility] ENDOSCOPY;  Service: Cardiovascular;  Laterality: N/A;  INPATIENT AT [place] WILL RETURN TO [place] PER  DOCTOR  BM   . Cardioversion N/A 12/23/2012    Procedure: CARDIOVERSION;  Surgeon: Ricki Rodriguez, MD;  Location: [facility] ENDOSCOPY;  Service: Cardiovascular;  Laterality: N/A;   Family History  Problem Relation Age of Onset  . Heart attack Mother   . Hypertension Mother    History  Substance Use Topics  . Smoking status: Former Smoker    Quit date: 08/05/1994  . Smokeless tobacco: Not on file  . Alcohol Use: No   OB History   Grav Para Term Preterm Abortions TAB SAB Ect Mult Living                 Review of Systems 10 point review of systems performed and is negative with the exception of symptoms noted above.  Allergies  Tetracyclines & related  Home Medications  No current outpatient prescriptions on file. BP 119/79  Pulse 87  Temp(Src) 98.4 F (36.9 C) (Oral)  Resp 20  SpO2 99%  LMP 12/17/2012 Physical Exam\ Gen: well developed and well nourished appearing, ill appearing Head: NCAT Eyes: PERL, EOMI Nose: no epistaixis or rhinorrhea Mouth/throat: mucosa is moist and pink, poor oral dentition with diffuse and severe caries Neck: supple, no stridor, + JVD Lungs: no wheezing, rhonchi. Bibasilar rales are present with mild accessory muscle use, RR 32/min CV: rapid and irregular, strong peripheral pulses Abd: soft, notender, nondistended Back: no ttp, no cva ttp Skin: no rashese, wnl Neuro: CN ii-xii grossly intact, no focal deficits Psyche; normal affect,  calm and cooperative. Poor insight into disease process demonstrated.  ED Course  Procedures (including critical care time) Labs Review  Results for orders placed during the hospital encounter of 01/08/13 (from the past 24 hour(s))  PRO B NATRIURETIC PEPTIDE     Status: Abnormal   Collection Time    01/08/13 11:00 PM      Result Value Range   Pro B Natriuretic peptide (BNP) 2886.0 (*) 0 - 125 pg/mL  CBC WITH DIFFERENTIAL     Status: Abnormal   Collection Time    01/08/13 11:00 PM      Result Value Range    WBC 6.6  4.0 - 10.5 K/uL   RBC 3.17 (*) 3.87 - 5.11 MIL/uL   Hemoglobin 10.1 (*) 12.0 - 15.0 g/dL   HCT 16.1 (*) 09.6 - 04.5 %   MCV 92.1  78.0 - 100.0 fL   MCH 31.9  26.0 - 34.0 pg   MCHC 34.6  30.0 - 36.0 g/dL   RDW 40.9  81.1 - 91.4 %   Platelets 259  150 - 400 K/uL   Neutrophils Relative % 58  43 - 77 %   Neutro Abs 3.8  1.7 - 7.7 K/uL   Lymphocytes Relative 35  12 - 46 %   Lymphs Abs 2.3  0.7 - 4.0 K/uL   Monocytes Relative 7  3 - 12 %   Monocytes Absolute 0.5  0.1 - 1.0 K/uL   Eosinophils Relative 0  0 - 5 %   Eosinophils Absolute 0.0  0.0 - 0.7 K/uL   Basophils Relative 1  0 - 1 %   Basophils Absolute 0.0  0.0 - 0.1 K/uL  COMPREHENSIVE METABOLIC PANEL     Status: Abnormal   Collection Time    01/08/13 11:00 PM      Result Value Range   Sodium 130 (*) 135 - 145 mEq/L   Potassium 3.9  3.5 - 5.1 mEq/L   Chloride 95 (*) 96 - 112 mEq/L   CO2 21  19 - 32 mEq/L   Glucose, Bld 106 (*) 70 - 99 mg/dL   BUN 13  6 - 23 mg/dL   Creatinine, Ser 7.82  0.50 - 1.10 mg/dL   Calcium 9.3  8.4 - 95.6 mg/dL   Total Protein 7.3  6.0 - 8.3 g/dL   Albumin 3.6  3.5 - 5.2 g/dL   AST 22  0 - 37 U/L   ALT 18  0 - 35 U/L   Alkaline Phosphatase 74  39 - 117 U/L   Total Bilirubin 0.9  0.3 - 1.2 mg/dL   GFR calc non Af Amer 80 (*) >90 mL/min   GFR calc Af Amer >90  >90 mL/min  PROTIME-INR     Status: Abnormal   Collection Time    01/08/13 11:00 PM      Result Value Range   Prothrombin Time 17.4 (*) 11.6 - 15.2 seconds   INR 1.47  0.00 - 1.49  POCT I-STAT TROPONIN I     Status: None   Collection Time    01/08/13 11:21 PM      Result Value Range   Troponin i, poc 0.01  0.00 - 0.08 ng/mL   Comment 3           GLUCOSE, CAPILLARY     Status: Abnormal   Collection Time    01/08/13 11:55 PM      Result Value Range   Glucose-Capillary 103 (*) 70 - 99 mg/dL  POCT I-STAT TROPONIN I  Status: None   Collection Time    01/09/13  2:26 AM      Result Value Range   Troponin i, poc 0.01  0.00  - 0.08 ng/mL   Comment 3            Imaging Review Dg Chest 2 View  01/09/2013   CLINICAL DATA:  cough and chest pain  EXAM: CHEST  2 VIEW  COMPARISON:  12/24/2012  FINDINGS: The heart size and mediastinal contours are within normal limits. Diffuse bilateral hazy lung opacities are identified. There is mild pleural fluid identified involving the left lung. The visualized skeletal structures are unremarkable.  IMPRESSION: 1. Suspect mild CHF.   Electronically Signed   By: Signa Kell M.D.   On: 01/09/2013 01:58    EKG Interpretation   None       MDM  Patient here with relapse of AF with RVR, medical non-compliance and mild pulmonary edema/CHF pattern which is likely secondary to several days of untreated AF with RVR. We have treated with diltiazem bolus followed by gtt at 15mg /hr. We are increasing dose to 20mg /hr and are loading with Amiodarone which will be followed by gtt. The patient has not been taking Xarelto - which was prescribed to her on discharge. We will tx with Lovenox [place].  The patient will require admission to the SDU. [facility].   CRITICAL CARE Performed by: Brandt Loosen   Total critical care time:32m  Critical care time was exclusive of separately billable procedures and treating other patients.  Critical care was necessary to treat or prevent imminent or life-threatening deterioration.  Critical care was time spent personally by me on the following activities: development of treatment plan with patient and/or surrogate as well as nursing, discussions with consultants, evaluation of patient's response to treatment, examination of patient, obtaining history from patient or surrogate, ordering and performing treatments and interventions, ordering and review of laboratory studies, ordering and review of radiographic studies, pulse oximetry and re-evaluation of patient's condition.     Brandt Loosen, MD 01/09/13 [phone]   [id number]:  Case discussed with Dr. Allena Katz who has  accepted the patient to the SDU.   Brandt Loosen, MD 01/09/13 [phone]

## 2013-01-09 NOTE — H&P (Signed)
[facility] History and Physical  Patient: Ashley Jones  JXB:[id number]  DOB: 11-11-1975  DOS: 01/09/2013 PCP: No PCP Per Patient  Chief Complaint: Dr. Arnoldo Morale  HPI: Ashley Jones is a 37 y.o. female with Past medical history of atrial fibrillation, history of DVT on hypothyroidism. Patient is coming from home She presented with the complaint of shortness of breath dizziness and chest pressure that has been ongoing since last few days. She was recently hospitalized for A. fib with RVR and underwent a cardioversion. She mentions that she has not refilled her medications and is not taking any medications for the discharge. She denies any fever, chills, nausea, vomiting, diarrhea, burning urination, cough.  She has chronic leg swelling in mentions that this is improving as compared to her prior condition. She denies any focal neurological deficit. She had chest tightness which was presented last night and stayed overnight and she woke up in the morning it improved some and currently it has resolved.  Review of Systems: as mentioned in the history of present illness.  A Comprehensive review of the other systems is negative.  Past Medical History  Diagnosis Date  . Hypertension   . Atrial fibrillation   . Lightheadedness   . Chest pain   . SOB (shortness of breath)   . Diaphoresis   . Nauseated   . Bipolar 1 disorder   . History of paranoid schizophrenia   . Multinodular goiter     BY THYROID ULTRASOUND 07/30/2010  . Hypothyroidism   . Anemia    Past Surgical History  Procedure Laterality Date  . Transthoracic echocardiogram  07/30/2010    Left ventricle: The cavity size was normal. Wall thickness was increased in a pattern of mild LVH. Systolic function was normal.  The estimated ejection fraction was in the range of 60% to 65%.  Rhae Hammock without cardioversion N/A 12/23/2012    Procedure: TRANSESOPHAGEAL ECHOCARDIOGRAM (TEE);  Surgeon: Ricki Rodriguez, MD;  Location: [facility]  ENDOSCOPY;  Service: Cardiovascular;  Laterality: N/A;  INPATIENT AT [place] WILL RETURN TO [place] PER DOCTOR  BM   . Cardioversion N/A 12/23/2012    Procedure: CARDIOVERSION;  Surgeon: Ricki Rodriguez, MD;  Location[facility] ENDOSCOPY;  Service: Cardiovascular;  Laterality: N/A;   Social History:  reports that she quit smoking about 18 years ago. She does not have any smokeless tobacco history on file. She reports that she does not drink alcohol or use illicit drugs. Independent for most of her  ADL.  Allergies  Allergen Reactions  . Tetracyclines & Related Hives    Family History  Problem Relation Age of Onset  . Heart attack Mother   . Hypertension Mother     Prior to Admission medications   Not on File    Physical Exam: Filed Vitals:   01/09/13 0300 01/09/13 0315 01/09/13 0330 01/09/13 0427  BP: 129/93 102/76 114/68 110/93  Pulse: 25 144 146   Temp:    97.5 F (36.4 C)  TempSrc:    Oral  Resp: 16 18 13    SpO2: 95% 99% 95%     General: Alert, Awake and Oriented to Time, Place and Person. Appear in moderate distress Eyes: PERRL ENT: Oral Mucosa clear moist. Neck: no JVD Cardiovascular: S1 and S2 Present, no Murmur, Peripheral Pulses Present Respiratory: Bilateral Air entry equal and Decreased, Clear to Auscultation,  no Crackles,no wheezes Abdomen: Bowel Sound Present, Soft and Non tender Skin: no Rash Extremities:  bilateral  Pedal edema, no calf  tenderness Neurologic: Grossly Unremarkable. Labs on Admission:  CBC:  Recent Labs Lab 01/08/13 2300  WBC 6.6  NEUTROABS 3.8  HGB 10.1*  HCT 29.2*  MCV 92.1  PLT 259    CMP     Component Value Date/Time   NA 130* 01/08/2013 2300   K 3.9 01/08/2013 2300   CL 95* 01/08/2013 2300   CO2 21 01/08/2013 2300   GLUCOSE 106* 01/08/2013 2300   BUN 13 01/08/2013 2300   CREATININE 0.90 01/08/2013 2300   CALCIUM 9.3 01/08/2013 2300   PROT 7.3 01/08/2013 2300   ALBUMIN 3.6 01/08/2013 2300   AST 22 01/08/2013 2300   ALT  18 01/08/2013 2300   ALKPHOS 74 01/08/2013 2300   BILITOT 0.9 01/08/2013 2300   GFRNONAA 80* 01/08/2013 2300   GFRAA >90 01/08/2013 2300    No results found for this basename: LIPASE, AMYLASE,  in the last 168 hours No results found for this basename: AMMONIA,  in the last 168 hours  No results found for this basename: CKTOTAL, CKMB, CKMBINDEX, TROPONINI,  in the last 168 hours BNP (last 3 results)  Recent Labs  12/20/12 2240 12/24/12 0540 01/08/13 2300  PROBNP 2844.0* 2257.0* 2886.0*    Radiological Exams on Admission: Dg Chest 2 View  01/09/2013   CLINICAL DATA:  cough and chest pain  EXAM: CHEST  2 VIEW  COMPARISON:  12/24/2012  FINDINGS: The heart size and mediastinal contours are within normal limits. Diffuse bilateral hazy lung opacities are identified. There is mild pleural fluid identified involving the left lung. The visualized skeletal structures are unremarkable.  IMPRESSION: 1. Suspect mild CHF.   Electronically Signed   By: Signa Kell M.D.   On: 01/09/2013 01:58    EKG: Independently reviewed. A. fib with RVR.  Assessment/Plan Principal Problem:   Atrial fibrillation with rapid ventricular response, paroxysmal Active Problems:   Hypothyroid   History of DVT (deep vein thrombosis)   Acute systolic congestive heart failure   1. Atrial fibrillation with rapid ventricular response the patient has been given IV Cardizem in the ED and then was started on amiodarone author giving a bolus of amiodarone. Currently she is continuing on amiodarone. She has not responded to the treatment with medications and her last admissions and required cardioversion. If she does not convert with this amiodarone than we will consult cardiology. More likely cause of recurrent A. fib it is noncompliance with medication. Initial set of troponin is negative and EKG does not show any ischemia. She had mild worsening of diastolic dysfunction which he may continue to monitor likely secondary  to A. Fib.  2. anticoagulation  The patient has not been taking any medications including blood thinners after the discharge. Currently she is given a dose of Lovenox in the ED, he may consult case management in the morning including pharmacy consult as it is unlikely that she would continue taking the over or anticoagulants and Coumadin might be the best option for her.due to financial issues.  3. hypothyroidism  Check TSH in the morning   DVT Prophylaxis: Heparin Nutrition: n.p.o. for possible emergent procedure   Code Status: For   Disposition: Admitted to npatient in step-down unit.  Author: Lynden Oxford, MD [facility] Pager: [phone] 01/09/2013, 4:57 AM    If 7PM-7AM, please contact night-coverage [email or web address] Password [email or web address]

## 2013-01-09 NOTE — Progress Notes (Signed)
Pt heart rate sustaining afib 130s while resting in chair. HR up to 150s when OOB. Dr Algie Coffer paged to be made aware. Will continue to monitor. Levonne Spiller, RN

## 2013-01-09 NOTE — ED Notes (Signed)
Patient currently in xray ?

## 2013-01-10 DIAGNOSIS — Z9119 Patient's noncompliance with other medical treatment and regimen: Secondary | ICD-10-CM

## 2013-01-10 DIAGNOSIS — N179 Acute kidney failure, unspecified: Secondary | ICD-10-CM

## 2013-01-10 DIAGNOSIS — I509 Heart failure, unspecified: Secondary | ICD-10-CM

## 2013-01-10 DIAGNOSIS — J811 Chronic pulmonary edema: Secondary | ICD-10-CM

## 2013-01-10 DIAGNOSIS — I5021 Acute systolic (congestive) heart failure: Secondary | ICD-10-CM

## 2013-01-10 LAB — BASIC METABOLIC PANEL
Calcium: 9.2 mg/dL (ref 8.4–10.5)
Chloride: 96 mEq/L (ref 96–112)
Creatinine, Ser: 1.42 mg/dL — ABNORMAL HIGH (ref 0.50–1.10)
GFR calc Af Amer: 53 mL/min — ABNORMAL LOW (ref >90)
Sodium: 130 mEq/L — ABNORMAL LOW (ref 135–145)

## 2013-01-10 LAB — CBC
Platelets: 258 10*3/uL (ref 150–400)
RDW: 14.9 % (ref 11.5–15.5)
WBC: 7.2 10*3/uL (ref 4.0–10.5)

## 2013-01-10 MED ORDER — FUROSEMIDE 40 MG PO TABS
40.0000 mg | ORAL_TABLET | Freq: Every day | ORAL | Status: DC
  Administered 2013-01-10 – 2013-01-11 (×2): 40 mg via ORAL
  Filled 2013-01-10 (×2): qty 1

## 2013-01-10 MED ORDER — METOPROLOL TARTRATE 25 MG PO TABS
25.0000 mg | ORAL_TABLET | Freq: Two times a day (BID) | ORAL | Status: DC
  Administered 2013-01-11: 25 mg via ORAL
  Filled 2013-01-10 (×2): qty 1

## 2013-01-10 MED ORDER — RIVAROXABAN 20 MG PO TABS
20.0000 mg | ORAL_TABLET | Freq: Every day | ORAL | Status: DC
  Administered 2013-01-10: 20 mg via ORAL
  Filled 2013-01-10 (×2): qty 1

## 2013-01-10 NOTE — Progress Notes (Signed)
[facility] PROGRESS NOTE  Ashley Jones ZOX:[id number] DOB: 12-02-1975 DOA: 01/08/2013 PCP: No PCP Per Patient  Assessment/Plan: 1. Atrial Fibrillation with RVR. Unfortunately patient did not have her prescriptions filled do to financial difficulties. Given her recent hospitalization where medical therapy did not control rates, ultimately requiring cardioversion, I have consulted cardiology. Previous workup has included a transthoracic echocardiogram performed on 12/20/2012 showing ejection fraction of 45-50% with diffuse hypokinesis. Patient converted to normal sinus rhythm overnight. Plan to transfer patient to telemetry today. I think can be taken off of her amiodarone drip, will touch base with cardiology. 2. Acute on chronic systolic congestive heart failure. Transthoracic echocardiogram showed an ejection fraction of 45-50%. Chest x-ray showing diffuse bilateral hazy lung opacities consistent with CHF. On exam she has 1-2+ bilateral extremity pitting edema. Patient states improvement to bilateral extremity pitting edema. Will transition her to oral Lasix at 40 mg by mouth daily 3. Acute renal failure. Likely secondary to diuresis with IV Lasix administered for CHF. Will discontinue IV Lasix, starting oral Lasix at 40 mg by mouth daily. Followup on a.m. BMP. 4. Anticoagulation. Will put her back on Xaroxolyn, social work consult for prescription assistance. 5. Chest Pain. She has had 2 negative sets of troponins thus far. Be secondary to A. fib and CHF. Cardiology consultation 6. History of DVT. Continue anticoagulation 7. Hypothyroidism. Thyroid studies done on 12/21/2012 at which time she had a TSH of 0.81 with free T4-1 0.6 and free T3 of 3.0  Code Status: Full Code Disposition Plan: Cardiology Consultation   Consultants:  Cardiology   HPI/Subjective: Patient is a pleasant 37 year old female with a past medical history of atrial fibrillation, recently discharged from [facility]  long hospital on 12/25/2012 at which time patient presented with atrial fibrillation with rapid ventricular response. Despite receiving amiodarone and beta blocker therapy, she remained in A. fib with RVR. Cardiology was consulted as she underwent successful direct current cardioversion on 12/23/2012. She was discharged in stable condition. Unfortunately she will did not fill her prescriptions, stating having financial difficulties and 40 minutes. Presented on 11/8 with atrial fibrillation with RVR.   Cardiology consulted, was maintained on metoprolol Cardizem and amiodarone drip. She converted to sinus rhythm yesterday evening at 8:10 PM. This morning she complains of ongoing cough with clear sputum. Continues to feel weak, although is afebrile, tolerating by mouth intake.  Objective: Filed Vitals:   01/10/13 0800  BP: 119/79  Pulse: 62  Temp: 97.5 F (36.4 C)  Resp: 22    Intake/Output Summary (Last 24 hours) at 01/10/13 0838 Last data filed at 01/10/13 0800  Gross per 24 hour  Intake 615.51 ml  Output    500 ml  Net 115.51 ml   Filed Weights   01/09/13 0818 01/10/13 0500  Weight: 103.8 kg (228 lb 13.4 oz) 110.2 kg (242 lb 15.2 oz)    Exam:   General:  Patient reporting chest tightness, no acute distress.   Cardiovascular: Tachycardic, irregular rate and rhythm, having 2 + bilateral pitting edema.   Respiratory: Improved lung exam, crackles not as prominent today. Normal respiratory effort.  Abdomen: Soft, nontender, nondistended  Musculoskeletal: She has 2 + bilateral lower extremity edema.   Data Reviewed: Basic Metabolic Panel:  Recent Labs Lab 01/08/13 2300 01/09/13 0925 01/10/13 0405  NA 130* 134* 130*  K 3.9 4.3 4.3  CL 95* 98 96  CO2 21 21 20   GLUCOSE 106* 107* 128*  BUN 13 14 19   CREATININE 0.90 0.97 1.42*  CALCIUM 9.3 9.1 9.2   Liver Function Tests:  Recent Labs Lab 01/08/13 2300 01/09/13 0925  AST 22 22  ALT 18 18  ALKPHOS 74 71  BILITOT  0.9 0.8  PROT 7.3 7.4  ALBUMIN 3.6 3.5   No results found for this basename: LIPASE, AMYLASE,  in the last 168 hours No results found for this basename: AMMONIA,  in the last 168 hours CBC:  Recent Labs Lab 01/08/13 2300 01/09/13 0925 01/10/13 0405  WBC 6.6 6.9 7.2  NEUTROABS 3.8  --   --   HGB 10.1* 9.9* 10.1*  HCT 29.2* 28.9* 30.0*  MCV 92.1 92.6 92.9  PLT 259 245 258   Cardiac Enzymes:  Recent Labs Lab 01/09/13 0925 01/09/13 1808  TROPONINI <0.30 <0.30   BNP (last 3 results)  Recent Labs  12/20/12 2240 12/24/12 0540 01/08/13 2300  PROBNP 2844.0* 2257.0* 2886.0*   CBG:  Recent Labs Lab 01/08/13 2355 01/10/13 0817  GLUCAP 103* 125*    No results found for this or any previous visit (from the past 240 hour(s)).   Studies: Dg Chest 2 View  01/09/2013   CLINICAL DATA:  cough and chest pain  EXAM: CHEST  2 VIEW  COMPARISON:  12/24/2012  FINDINGS: The heart size and mediastinal contours are within normal limits. Diffuse bilateral hazy lung opacities are identified. There is mild pleural fluid identified involving the left lung. The visualized skeletal structures are unremarkable.  IMPRESSION: 1. Suspect mild CHF.   Electronically Signed   By: Signa Kell M.D.   On: 01/09/2013 01:58    Scheduled Meds: . diltiazem  240 mg Oral Daily  . furosemide  40 mg Oral Daily  . metoprolol tartrate  50 mg Oral BID  . pneumococcal 23 valent vaccine  0.5 mL Intramuscular Tomorrow-1000  . sodium chloride  3 mL Intravenous Q12H   Continuous Infusions: . amiodarone (NEXTERONE PREMIX) 360 mg/200 mL dextrose 30 mg/hr (01/10/13 0410)    Principal Problem:   Atrial fibrillation with rapid ventricular response, paroxysmal Active Problems:   Hypothyroid   History of DVT (deep vein thrombosis)   Acute systolic congestive heart failure    Time spent: 35 minutes    Jeralyn Bennett  [facility] Pager [phone]. If 7PM-7AM, please contact night-coverage at  [email or web address], password [facility] 01/10/2013, 8:38 AM  LOS: 2 days

## 2013-01-10 NOTE — Clinical Social Work Note (Signed)
CSW consulted because pt has difficulty affording medications--CSW consulted with RNCM, who is in the process of securing medications for this pt. CSW asked RNCM if there were any additional services CSW could provide, but RNCM assured CSW that she is taking care of pt's needs. CSW signing off.   Maryclare Labrador, MSW, [facility] Clinical Social Worker [phone]

## 2013-01-10 NOTE — Progress Notes (Signed)
Subjective:  Patient denies any chest pain or shortness of breath states feels better.  Converted back into sinus rhythm.  Objective:  Vital Signs in the last 24 hours: Temp:  [97.5 F (36.4 C)-98.8 F (37.1 C)] 97.5 F (36.4 C) (11/10 0800) Pulse Rate:  [54-120] 62 (11/10 0800) Resp:  [14-27] 22 (11/10 0800) BP: (88-119)/(62-79) 119/79 mmHg (11/10 0800) SpO2:  [93 %-100 %] 100 % (11/10 0800) Weight:  [110.2 kg (242 lb 15.2 oz)] 110.2 kg (242 lb 15.2 oz) (11/10 0500)  Intake/Output from previous day: 11/09 0701 - 11/10 0700 In: 588.7 [I.V.:588.7] Out: 500 [Urine:500] Intake/Output from this shift: Total I/O In: 290.1 [P.O.:240; I.V.:50.1] Out: -   Physical Exam: Neck: no adenopathy, no carotid bruit, no JVD and supple, symmetrical, trachea midline Lungs: decreased breath sounds at bases Heart: regular rate and rhythm, S1, S2 normal, no murmur, click, rub or gallop Abdomen: soft, non-tender; bowel sounds normal; no masses,  no organomegaly Extremities: no clubbing cyanosis 3+ edema noted  Lab Results:  Recent Labs  01/09/13 0925 01/10/13 0405  WBC 6.9 7.2  HGB 9.9* 10.1*  PLT 245 258    Recent Labs  01/09/13 0925 01/10/13 0405  NA 134* 130*  K 4.3 4.3  CL 98 96  CO2 21 20  GLUCOSE 107* 128*  BUN 14 19  CREATININE 0.97 1.42*    Recent Labs  01/09/13 0925 01/09/13 1808  TROPONINI <0.30 <0.30   Hepatic Function Panel  Recent Labs  01/09/13 0925  PROT 7.4  ALBUMIN 3.5  AST 22  ALT 18  ALKPHOS 71  BILITOT 0.8   No results found for this basename: CHOL,  in the last 72 hours No results found for this basename: PROTIME,  in the last 72 hours  Imaging: Imaging results have been reviewed and Dg Chest 2 View  01/09/2013   CLINICAL DATA:  cough and chest pain  EXAM: CHEST  2 VIEW  COMPARISON:  12/24/2012  FINDINGS: The heart size and mediastinal contours are within normal limits. Diffuse bilateral hazy lung opacities are identified. There is mild  pleural fluid identified involving the left lung. The visualized skeletal structures are unremarkable.  IMPRESSION: 1. Suspect mild CHF.   Electronically Signed   By: Signa Kell M.D.   On: 01/09/2013 01:58    Cardiac Studies:  Assessment/Plan:  Status post recurrentA.fibrillation with RVR  Uncontrolled hypertension  Mild decompensated congestive heart failure secondary to preserved systolic function Hypothyroidism  History of multinodular goiter  Remote tobacco abuse  History of DVT  History of paranoid schizophrenia  Morbid obesity  Probable obstructive sleep apnea/obesity hypoventilation syndrome  Anemia etiology unclear  Medication non-compliance Plan Continue present management I will follow  p.r.n. basis  LOS: 2 days    Ashley Jones N 01/10/2013, 11:50 AM

## 2013-01-10 NOTE — Progress Notes (Signed)
Report called to receiving [id number]

## 2013-01-10 NOTE — Care Management Note (Addendum)
    Page 1 of 2   01/11/2013     11:27:05 AM   CARE MANAGEMENT NOTE 01/11/2013  Patient:  Ashley Jones, Ashley Jones   Account Number:  [id number]  Date Initiated:  01/10/2013  Documentation initiated by:  Junius Creamer  Subjective/Objective Assessment:   adm w at fib w rvr     Action/Plan:   lives w fam, prev hosp cm had worked on appt for [place] [facility]. [facility] and [facility].   Anticipated DC Date:     Anticipated DC Plan:        DC Planning Services  CM consult  Medication Assistance  [facility]  [facility]  Follow-up appt scheduled      Choice offered to / List presented to:             Status of service:  Completed, signed off Medicare Important Message given?   (If response is "NO", the following Medicare IM given date fields will be blank) Date Medicare IM given:   Date Additional Medicare IM given:    Discharge Disposition:  HOME/SELF CARE  Per UR Regulation:  Reviewed for med. necessity/level of care/duration of stay  If discussed at Long Length of Stay Meetings, dates discussed:    Comments:  01-11-13 [address] [place], [place] [phone] CM did speak to pt and the [facility] program will be utilized for medications except for xarelto.  Pt has 30 day free card for xarelto. Pt is aware of plan and she [facility] medicaitons. Cost has been overridden. No co pay for meds. Pt also has [employer] pt assistance forms. Pt to fill out her part and have the [facility] f/u with form at appointment time on Thursay 01-13-13 at 11:30. The SW will fax pt assist forms off for xarelto. Pt is aware that if she runs out of xarelto before approved for year supply to call MD Harwani and ask for samples. Pt will also f/u at clinic for orange card after MD appointment. She has the paperwork stating what information to bring to appointment. Pt verbalized understanding of plan of care.  No further needs from CM at this time.    11/10  1039a debbie dowell rn,bsn spoke w pt. she states she has food and housing but cannot pay any money for healthcare or meds. [facility] and i am trying to find exact day and time of appt. pt has xarelto card last adm but did not get any meds filled. enc pt to use xarelto card and gave her another card. have started match letter and have asked supervisor for override for copays. will need to know exact meds and override has to be for each. have reported to cm on 3w of what has been done.

## 2013-01-10 NOTE — Progress Notes (Signed)
ANTICOAGULATION CONSULT NOTE - Initial Consult  Pharmacy Consult for xarelto Indication: afib  Allergies  Allergen Reactions  . Tetracyclines & Related Hives    Patient Measurements: Height: 5\' 9"  (175.3 cm) Weight: 242 lb 15.2 oz (110.2 kg) IBW/kg (Calculated) : 66.2  Vital Signs: Temp: 97.5 F (36.4 C) (11/10 0800) Temp src: Oral (11/10 0800) BP: 119/79 mmHg (11/10 0800) Pulse Rate: 62 (11/10 0800)  Labs:  Recent Labs  01/08/13 2300 01/09/13 0925 01/09/13 1808 01/10/13 0405  HGB 10.1* 9.9*  --  10.1*  HCT 29.2* 28.9*  --  30.0*  PLT 259 245  --  258  LABPROT 17.4*  --   --   --   INR 1.47  --   --   --   CREATININE 0.90 0.97  --  1.42*  TROPONINI  --  <0.30 <0.30  --     Estimated Creatinine Clearance: 70.4 ml/min (by C-G formula based on Cr of 1.42).   Medical History: Past Medical History  Diagnosis Date  . Hypertension   . Atrial fibrillation   . Lightheadedness   . Chest pain   . SOB (shortness of breath)   . Diaphoresis   . Nauseated   . Bipolar 1 disorder   . History of paranoid schizophrenia   . Multinodular goiter     BY THYROID ULTRASOUND 07/30/2010  . Hypothyroidism   . Anemia     Medications:  No prescriptions prior to admission   Scheduled:  . diltiazem  240 mg Oral Daily  . furosemide  40 mg Oral Daily  . metoprolol tartrate  50 mg Oral BID  . sodium chloride  3 mL Intravenous Q12H    Assessment: 37 yo female with afib (recently cardioverted on 12/20/12) to begin Xarelto. Patient noted on lovenox and last dose of 105mg  was given this am at 5am. Hg/hct= 12/01/28, plt= 258, SCr= 1.42 and up from 0.97 (recently SCr has been 0.77-0.97).  Goal of Therapy:  Monitor platelets by anticoagulation protocol: Yes   Plan:  -Begin Xarelto 20mg  po daily beginning with dinner -Will watch SCr trend  -Will provide patient education  Harland German, Pharm D 01/10/2013 11:22 AM

## 2013-01-10 NOTE — Progress Notes (Addendum)
Pt converted to sinus rhythm at 8:10 PM,confirmed with 12 leads EKG,heart rate at 57/min,BP 102/69mmHG, Dr.Kadakia made aware without orders made,will continue to monitor.

## 2013-01-11 DIAGNOSIS — Z86718 Personal history of other venous thrombosis and embolism: Secondary | ICD-10-CM

## 2013-01-11 LAB — GLUCOSE, CAPILLARY: Glucose-Capillary: 95 mg/dL (ref 70–99)

## 2013-01-11 LAB — CBC
HCT: 29.2 % — ABNORMAL LOW (ref 36.0–46.0)
Platelets: 245 10*3/uL (ref 150–400)
RBC: 3.17 MIL/uL — ABNORMAL LOW (ref 3.87–5.11)
WBC: 7.5 10*3/uL (ref 4.0–10.5)

## 2013-01-11 LAB — BASIC METABOLIC PANEL
BUN: 20 mg/dL (ref 6–23)
CO2: 22 mEq/L (ref 19–32)
Calcium: 9.2 mg/dL (ref 8.4–10.5)
Chloride: 95 mEq/L — ABNORMAL LOW (ref 96–112)
Glucose, Bld: 129 mg/dL — ABNORMAL HIGH (ref 70–99)
Potassium: 3.4 mEq/L — ABNORMAL LOW (ref 3.5–5.1)
Sodium: 130 mEq/L — ABNORMAL LOW (ref 135–145)

## 2013-01-11 MED ORDER — POTASSIUM CHLORIDE CRYS ER 20 MEQ PO TBCR
40.0000 meq | EXTENDED_RELEASE_TABLET | Freq: Once | ORAL | Status: AC
  Administered 2013-01-11: 40 meq via ORAL
  Filled 2013-01-11: qty 2

## 2013-01-11 MED ORDER — FUROSEMIDE 40 MG PO TABS: 40.0000 mg | ORAL_TABLET | Freq: Every day | ORAL | Status: AC

## 2013-01-11 MED ORDER — DILTIAZEM HCL ER COATED BEADS 180 MG PO TB24: 180.0000 mg | ORAL_TABLET | Freq: Every day | ORAL | Status: AC

## 2013-01-11 MED ORDER — AMIODARONE HCL 200 MG PO TABS: ORAL_TABLET | ORAL | Status: AC

## 2013-01-11 MED ORDER — METOPROLOL TARTRATE 25 MG PO TABS: 25.0000 mg | ORAL_TABLET | Freq: Two times a day (BID) | ORAL | Status: AC

## 2013-01-11 MED ORDER — RIVAROXABAN 20 MG PO TABS: 20.0000 mg | ORAL_TABLET | Freq: Every day | ORAL | Status: AC

## 2013-01-11 MED ORDER — AMIODARONE HCL 200 MG PO TABS
200.0000 mg | ORAL_TABLET | Freq: Two times a day (BID) | ORAL | Status: DC
  Administered 2013-01-11: 200 mg via ORAL
  Filled 2013-01-11 (×2): qty 1

## 2013-01-11 NOTE — Progress Notes (Signed)
I visited with Ashley Jones today and the visit was a short and a bit awkward. The patient was very guarded with answering my questions in our conservation together. She only answered questions that she wanted to answer and the questions she didn't want to answer she did not answer. We also talked about God for a while but she told me to stop talking about him, so I did. For a while there was a silence, so I noticed that she wanted to be left alone but before I left the room I asked her if she wanted me to pray with her and she declined my offer.  Chaplain Bryson Ha

## 2013-01-11 NOTE — Progress Notes (Signed)
Subjective:  Patient denies any chest pain or shortness of breath. Remains in sinus rhythm. Lopressor was held last night due to bradycardia and hypotension. Still on IV amiodarone will switch to by mouth  Objective:  Vital Signs in the last 24 hours: Temp:  [97.5 F (36.4 C)-97.9 F (36.6 C)] 97.7 F (36.5 C) (11/11 0602) Pulse Rate:  [58-81] 58 (11/11 0602) Resp:  [18-20] 18 (11/11 0602) BP: (107-122)/(73-88) 122/88 mmHg (11/11 0602) SpO2:  [97 %-99 %] 99 % (11/11 0602) Weight:  [113.5 kg (250 lb 3.6 oz)] 113.5 kg (250 lb 3.6 oz) (11/11 0602)  Intake/Output from previous day: 11/10 0701 - 11/11 0700 In: 1232.1 [P.O.:1182; I.V.:50.1] Out: 100 [Urine:100] Intake/Output from this shift:    Physical Exam: Neck: no adenopathy, no carotid bruit, no JVD and supple, symmetrical, trachea midline Lungs: Decreased breath sound at bases Heart: regular rate and rhythm, S1, S2 normal, no murmur, click, rub or gallop Abdomen: soft, non-tender; bowel sounds normal; no masses,  no organomegaly Extremities: No clubbing cyanosis 2+ edema noted  Lab Results:  Recent Labs  01/10/13 0405 01/11/13 0515  WBC 7.2 7.5  HGB 10.1* 9.7*  PLT 258 245    Recent Labs  01/10/13 0405 01/11/13 0515  NA 130* 130*  K 4.3 3.4*  CL 96 95*  CO2 20 22  GLUCOSE 128* 129*  BUN 19 20  CREATININE 1.42* 1.29*    Recent Labs  01/09/13 0925 01/09/13 1808  TROPONINI <0.30 <0.30   Hepatic Function Panel  Recent Labs  01/09/13 0925  PROT 7.4  ALBUMIN 3.5  AST 22  ALT 18  ALKPHOS 71  BILITOT 0.8   No results found for this basename: CHOL,  in the last 72 hours No results found for this basename: PROTIME,  in the last 72 hours  Imaging: Imaging results have been reviewed and No results found.  Cardiac Studies:  Assessment/Plan:  Status post recurrentA.fibrillation with RVR  Uncontrolled hypertension  Mild decompensated congestive heart failure secondary to preserved systolic function   Hypothyroidism  History of multinodular goiter  Remote tobacco abuse  History of DVT  History of paranoid schizophrenia  Morbid obesity  Probable obstructive sleep apnea/obesity hypoventilation syndrome  Anemia etiology unclear  Medication non-compliance Plan Change IV amiodarone to by mouth 200 twice a day for one week and then 200 mg daily Reduce Lopressor as per orders Okay to discharge from cardiac point of view Followup with me in one week  LOS: 3 days    Ashley Jones N 01/11/2013, 8:45 AM

## 2013-01-11 NOTE — Discharge Summary (Signed)
Physician Discharge Summary  Geral Tuch WGN:[id number] DOB: [birth year] DOA: 01/08/2013  PCP: No PCP Per Patient  Admit date: 01/08/2013 Discharge date: 01/11/2013  Time spent: 40 minutes  Recommendations for Outpatient Follow-up:  1. Please followup on patient's heart rates, presented with A. Fib with RVR. 2. Followup on BMP in 1 week. During this hospitalization had a rising creatinine from diuresis.  Discharge Diagnoses:  Principal Problem:   Atrial fibrillation with rapid ventricular response, paroxysmal Active Problems:   Hypothyroid   History of DVT (deep vein thrombosis)   Acute systolic congestive heart failure   Discharge Condition: Stable/improved  Diet recommendation: Heart healthy diet  Filed Weights   01/09/13 0818 01/10/13 0500 01/11/13 0602  Weight: 103.8 kg (228 lb 13.4 oz) 110.2 kg (242 lb 15.2 oz) 113.5 kg (250 lb 3.6 oz)    History of present illness:  Ashley Jones is a 37 y.o. female with Past medical history of atrial fibrillation, history of DVT on hypothyroidism. Patient is coming from home  She presented with the complaint of shortness of breath dizziness and chest pressure that has been ongoing since last few days. She was recently hospitalized for A. fib with RVR and underwent a cardioversion. She mentions that she has not refilled her medications and is not taking any medications for the discharge.  She denies any fever, chills, nausea, vomiting, diarrhea, burning urination, cough.  She has chronic leg swelling in mentions that this is improving as compared to her prior condition.  She denies any focal neurological deficit.  She had chest tightness which was presented last night and stayed overnight and she woke up in the morning it improved some and currently it has resolved.  Hospital Course:  Patient is a pleasant 36 year old female with a past medical history of atrial fibrillation, who was recently discharged from [facility] long hospital on  12/25/2012 at which time patient presented with atrial fibrillation with rapid ventricular response. During that hospitalization she underwent successful direct current cardioversion on 12/23/2012. She was discharged in stable condition unfortunately did not fill her prescription medications citing financial difficulties. She presented back to the emergency room on 01/08/2013 in atrial fibrillation with rapid ventricular response. Cardiology was consulted. Patient was initially admitted to the step down unit, where she was started on an amiodarone drip, with metoprolol and Cardizem restarted. On the evening of 01/09/2013 she spontaneously converted to sinus rhythm. She was also diuresis with IV Lasix given the probability of mild CHF. Patient showed significant clinical improvement, as if she remained in sinus rhythm. By 01/11/2013, her IV amiodarone was discontinued and she was switched to oral amiodarone. Case management was consulted regarding her ability to obtain prescription medications. She had reported inability to afford co-pays. Patient was given a one-month supply of all of her medications. She was discharged in stable condition on 01/11/2013. Prior to discharge I explained to her the importance of taking all of her medications and reporting to her cardiologist any difficulties in obtaining meds. She verbalized understanding.    Consultations:  Cardiology  Discharge Exam: Filed Vitals:   01/11/13 0602  BP: 122/88  Pulse: 58  Temp: 97.7 F (36.5 C)  Resp: 18   General: Patient reporting chest tightness, no acute distress.  Cardiovascular: Tachycardic, irregular rate and rhythm, having 2 + bilateral pitting edema.  Respiratory: Improved lung exam, crackles not as prominent today. Normal respiratory effort. Abdomen: Soft, nontender, nondistended  Musculoskeletal: She has 2 + bilateral lower extremity edema.     Discharge  Instructions  Discharge Orders   Future Appointments Provider  Department Dept Phone   01/13/2013 11:30 AM [facility] Covering Provider 2 [facility] [phone]   Future Orders Complete By Expires   Call MD for:  difficulty breathing, headache or visual disturbances  As directed    Call MD for:  extreme fatigue  As directed    Call MD for:  persistant dizziness or light-headedness  As directed    Call MD for:  persistant nausea and vomiting  As directed    Call MD for:  temperature >100.4  As directed    Diet - low sodium heart healthy  As directed    Discharge instructions  As directed    Comments:     It is very important to take your medications. If you are having difficulties getting your prescription medications please call Dr Annitta Jersey office immediately. The case managers having given you a 1 month supply of your prescription medications on hospital discharge.   Increase activity slowly  As directed        Medication List         amiodarone 200 MG tablet  Commonly known as:  PACERONE  Take one tab twice daily for 1 week then 1 tab daily for 1 week then stop     diltiazem 180 MG 24 hr tablet  Commonly known as:  CARDIZEM LA  Take 1 tablet (180 mg total) by mouth daily.     furosemide 40 MG tablet  Commonly known as:  LASIX  Take 1 tablet (40 mg total) by mouth daily.     metoprolol tartrate 25 MG tablet  Commonly known as:  LOPRESSOR  Take 1 tablet (25 mg total) by mouth 2 (two) times daily.     Rivaroxaban 20 MG Tabs tablet  Commonly known as:  XARELTO  Take 1 tablet (20 mg total) by mouth daily with supper.       Allergies  Allergen Reactions  . Tetracyclines & Related Hives       Follow-up Information   Follow up with No PCP Per Patient In 1 week.   Specialty:  General Practice   Contact information:   [address] [place] [address] [phone]       Follow up with Robynn Pane, MD In 1 week.   Specialty:  Cardiology   Contact information:   [address] [place] [address] [phone]        The results of significant diagnostics from this hospitalization (including imaging, microbiology, ancillary and laboratory) are listed below for reference.    Significant Diagnostic Studies: Dg Chest 2 View  01/09/2013   CLINICAL DATA:  cough and chest pain  EXAM: CHEST  2 VIEW  COMPARISON:  12/24/2012  FINDINGS: The heart size and mediastinal contours are within normal limits. Diffuse bilateral hazy lung opacities are identified. There is mild pleural fluid identified involving the left lung. The visualized skeletal structures are unremarkable.  IMPRESSION: 1. Suspect mild CHF.   Electronically Signed   By: Signa Kell M.D.   On: 01/09/2013 01:58   Ct Angio Chest W/cm &/or Wo Cm  12/21/2012   *RADIOLOGY REPORT*  Clinical Data: Shortness of breath, weakness, history of deep vein thrombosis and pulmonary embolism.  CT ANGIOGRAPHY CHEST  Technique:  Multidetector CT imaging of the chest using the standard protocol during bolus administration of intravenous contrast. Multiplanar reconstructed images including MIPs were obtained and reviewed to  evaluate the vascular anatomy.  Contrast: OMNIPAQUE IOHEXOL 350 MG/ML SOLN  Comparison: CT angiogram of the chest August 17, 2010.  Findings: Main pulmonary artery is not enlarged.  No pulmonary arterial filling defects to the level of the subsegmental branches.  Moderate right and small left layering pleural effusions.  Mild interstitial prominence, with mild bronchial wall thickening, and patchy ground-glass opacities.  No pneumothorax. 3 mm right middle lobe subpleural nodule is likely benign.  The heart appears mild to moderately enlarged, with enlarged right atrium, and reflux of contrast into the liver.thoracic aorta is normal in course and caliber.  Small mediastinal lymph nodes may be reactive.  Diffuse mild anasarca.  Thyroid is difficult to chacterize though, appears enlarged withpunctate  calcifications; thyroid ultrasound Jul 30, 2010 demonstrated multinodular goiter.  IMPRESSION: No evidence of pulmonary embolism to the level of subsegmental branches.  Cardiomegaly, enlarged right atrium suggesting congestive heart failure, with moderate right and small left pleural effusion. Vascular congestion with patchy ground-glass opacities and bronchial wall thickening most consistent with pulmonary edema. Mild anasarca.   Original Report Authenticated By: Awilda Metro   Dg Chest Port 1 View  12/24/2012   CLINICAL DATA:  Cough. Evaluate pleural effusion.  EXAM: PORTABLE CHEST - 1 VIEW  COMPARISON:  12/22/2012  FINDINGS: PICC line tip in the SVC region. Again noted is enlargement of the cardiac silhouette. Again noted are hazy densities at lung bases which may represent effusions. Upper lungs remain clear.  IMPRESSION: Persistent hazy densities at the lung bases that probably represent pleural effusions. Minimal change from the previous examination.  Mild enlargement of the cardiac silhouette.   Electronically Signed   By: Richarda Overlie M.D.   On: 12/24/2012 08:04   Dg Chest Port 1 View  12/22/2012   CLINICAL DATA:  Status post PICC line placement  EXAM: PORTABLE CHEST - 1 VIEW  COMPARISON:  10/20/ 14  FINDINGS: The cardiac shadow is stable. A right-sided PICC line is seen with the catheter tip in the mid superior vena cava. Increased density is noted on the right consistent with the known right-sided effusion.  IMPRESSION: Status post PICC line as described.   Electronically Signed   By: Alcide Clever M.D.   On: 12/22/2012 12:15   Dg Chest Portable 1 View  12/20/2012   CLINICAL DATA:  Shortness of breath and cough.  EXAM: PORTABLE CHEST - 1 VIEW  COMPARISON:  05/11/2012  FINDINGS: The heart size and mediastinal contours are within normal limits. Both lungs are clear. The visualized skeletal structures are unremarkable.  IMPRESSION: No active disease.   Electronically Signed   By: Burman Nieves  M.D.   On: 12/20/2012 22:55    Microbiology: No results found for this or any previous visit (from the past 240 hour(s)).   Labs: Basic Metabolic Panel:  Recent Labs Lab 01/08/13 2300 01/09/13 0925 01/10/13 0405 01/11/13 0515  NA 130* 134* 130* 130*  K 3.9 4.3 4.3 3.4*  CL 95* 98 96 95*  CO2 21 21 20 22   GLUCOSE 106* 107* 128* 129*  BUN 13 14 19 20   CREATININE 0.90 0.97 1.42* 1.29*  CALCIUM 9.3 9.1 9.2 9.2   Liver Function Tests:  Recent Labs Lab 01/08/13 2300 01/09/13 0925  AST 22 22  ALT 18 18  ALKPHOS 74 71  BILITOT 0.9 0.8  PROT 7.3 7.4  ALBUMIN 3.6 3.5   No results found for this basename: LIPASE, AMYLASE,  in the last 168 hours No results  found for this basename: AMMONIA,  in the last 168 hours CBC:  Recent Labs Lab 01/08/13 2300 01/09/13 0925 01/10/13 0405 01/11/13 0515  WBC 6.6 6.9 7.2 7.5  NEUTROABS 3.8  --   --   --   HGB 10.1* 9.9* 10.1* 9.7*  HCT 29.2* 28.9* 30.0* 29.2*  MCV 92.1 92.6 92.9 92.1  PLT 259 245 258 245   Cardiac Enzymes:  Recent Labs Lab 01/09/13 0925 01/09/13 1808  TROPONINI <0.30 <0.30   BNP: BNP (last 3 results)  Recent Labs  12/20/12 2240 12/24/12 0540 01/08/13 2300  PROBNP 2844.0* 2257.0* 2886.0*   CBG:  Recent Labs Lab 01/08/13 2355 01/10/13 0817 01/11/13 0752  GLUCAP 103* 125* 95       Signed:  Jeralyn Bennett  [facility] 01/11/2013, 10:58 AM

## 2013-01-13 ENCOUNTER — Other Ambulatory Visit: Payer: Self-pay

## 2013-01-13 ENCOUNTER — Inpatient Hospital Stay: Payer: Self-pay

## 2013-03-08 ENCOUNTER — Inpatient Hospital Stay (HOSPITAL_COMMUNITY): Payer: Medicaid Other

## 2013-03-08 ENCOUNTER — Emergency Department (HOSPITAL_COMMUNITY): Payer: Medicaid Other

## 2013-03-08 ENCOUNTER — Encounter (HOSPITAL_COMMUNITY): Payer: Self-pay | Admitting: Radiology

## 2013-03-08 ENCOUNTER — Inpatient Hospital Stay (HOSPITAL_COMMUNITY)
Admission: EM | Admit: 2013-03-08 | Discharge: 2013-04-03 | DRG: 308 | Disposition: E | Payer: Medicaid Other | Attending: Pulmonary Disease | Admitting: Pulmonary Disease

## 2013-03-08 DIAGNOSIS — Z515 Encounter for palliative care: Secondary | ICD-10-CM

## 2013-03-08 DIAGNOSIS — E162 Hypoglycemia, unspecified: Secondary | ICD-10-CM | POA: Diagnosis not present

## 2013-03-08 DIAGNOSIS — I319 Disease of pericardium, unspecified: Secondary | ICD-10-CM | POA: Diagnosis present

## 2013-03-08 DIAGNOSIS — Z86718 Personal history of other venous thrombosis and embolism: Secondary | ICD-10-CM

## 2013-03-08 DIAGNOSIS — I428 Other cardiomyopathies: Secondary | ICD-10-CM | POA: Diagnosis present

## 2013-03-08 DIAGNOSIS — I5023 Acute on chronic systolic (congestive) heart failure: Secondary | ICD-10-CM | POA: Diagnosis present

## 2013-03-08 DIAGNOSIS — I4901 Ventricular fibrillation: Principal | ICD-10-CM | POA: Diagnosis present

## 2013-03-08 DIAGNOSIS — I059 Rheumatic mitral valve disease, unspecified: Secondary | ICD-10-CM | POA: Diagnosis present

## 2013-03-08 DIAGNOSIS — I469 Cardiac arrest, cause unspecified: Secondary | ICD-10-CM

## 2013-03-08 DIAGNOSIS — R569 Unspecified convulsions: Secondary | ICD-10-CM | POA: Diagnosis not present

## 2013-03-08 DIAGNOSIS — G936 Cerebral edema: Secondary | ICD-10-CM | POA: Diagnosis not present

## 2013-03-08 DIAGNOSIS — I4891 Unspecified atrial fibrillation: Secondary | ICD-10-CM | POA: Diagnosis not present

## 2013-03-08 DIAGNOSIS — Z881 Allergy status to other antibiotic agents status: Secondary | ICD-10-CM

## 2013-03-08 DIAGNOSIS — Z7901 Long term (current) use of anticoagulants: Secondary | ICD-10-CM

## 2013-03-08 DIAGNOSIS — I340 Nonrheumatic mitral (valve) insufficiency: Secondary | ICD-10-CM

## 2013-03-08 DIAGNOSIS — Z87891 Personal history of nicotine dependence: Secondary | ICD-10-CM

## 2013-03-08 DIAGNOSIS — J9600 Acute respiratory failure, unspecified whether with hypoxia or hypercapnia: Secondary | ICD-10-CM

## 2013-03-08 DIAGNOSIS — E874 Mixed disorder of acid-base balance: Secondary | ICD-10-CM | POA: Diagnosis present

## 2013-03-08 DIAGNOSIS — I5021 Acute systolic (congestive) heart failure: Secondary | ICD-10-CM

## 2013-03-08 DIAGNOSIS — N170 Acute kidney failure with tubular necrosis: Secondary | ICD-10-CM | POA: Diagnosis present

## 2013-03-08 DIAGNOSIS — F209 Schizophrenia, unspecified: Secondary | ICD-10-CM | POA: Diagnosis present

## 2013-03-08 DIAGNOSIS — I519 Heart disease, unspecified: Secondary | ICD-10-CM

## 2013-03-08 DIAGNOSIS — I10 Essential (primary) hypertension: Secondary | ICD-10-CM | POA: Diagnosis present

## 2013-03-08 DIAGNOSIS — I509 Heart failure, unspecified: Secondary | ICD-10-CM | POA: Diagnosis present

## 2013-03-08 DIAGNOSIS — F319 Bipolar disorder, unspecified: Secondary | ICD-10-CM | POA: Diagnosis present

## 2013-03-08 DIAGNOSIS — R570 Cardiogenic shock: Secondary | ICD-10-CM | POA: Diagnosis present

## 2013-03-08 DIAGNOSIS — E042 Nontoxic multinodular goiter: Secondary | ICD-10-CM | POA: Diagnosis present

## 2013-03-08 DIAGNOSIS — E662 Morbid (severe) obesity with alveolar hypoventilation: Secondary | ICD-10-CM | POA: Diagnosis present

## 2013-03-08 DIAGNOSIS — J690 Pneumonitis due to inhalation of food and vomit: Secondary | ICD-10-CM | POA: Diagnosis present

## 2013-03-08 DIAGNOSIS — Z8249 Family history of ischemic heart disease and other diseases of the circulatory system: Secondary | ICD-10-CM

## 2013-03-08 DIAGNOSIS — G4733 Obstructive sleep apnea (adult) (pediatric): Secondary | ICD-10-CM | POA: Diagnosis present

## 2013-03-08 DIAGNOSIS — G931 Anoxic brain damage, not elsewhere classified: Secondary | ICD-10-CM | POA: Diagnosis present

## 2013-03-08 DIAGNOSIS — I5022 Chronic systolic (congestive) heart failure: Secondary | ICD-10-CM

## 2013-03-08 DIAGNOSIS — E039 Hypothyroidism, unspecified: Secondary | ICD-10-CM | POA: Diagnosis present

## 2013-03-08 LAB — POCT I-STAT 3, ART BLOOD GAS (G3+)
ACID-BASE DEFICIT: 19 mmol/L — AB (ref 0.0–2.0)
ACID-BASE DEFICIT: 16 mmol/L — AB (ref 0.0–2.0)
ACID-BASE DEFICIT: 8 mmol/L — AB (ref 0.0–2.0)
Acid-base deficit: 13 mmol/L — ABNORMAL HIGH (ref 0.0–2.0)
Bicarbonate: 14.1 mEq/L — ABNORMAL LOW (ref 20.0–24.0)
Bicarbonate: 16 mEq/L — ABNORMAL LOW (ref 20.0–24.0)
Bicarbonate: 18 mEq/L — ABNORMAL LOW (ref 20.0–24.0)
Bicarbonate: 21.6 mEq/L (ref 20.0–24.0)
O2 SAT: 78 %
O2 SAT: 85 %
O2 SAT: 100 %
O2 Saturation: 100 %
PCO2 ART: 57.9 mmHg — AB (ref 35.0–45.0)
PCO2 ART: 53.5 mmHg — AB (ref 35.0–45.0)
PO2 ART: 69 mmHg — AB (ref 80.0–100.0)
PO2 ART: 429 mmHg — AB (ref 80.0–100.0)
Patient temperature: 98.6
Patient temperature: 32.5
Patient temperature: 33.3
Patient temperature: 32.9
TCO2: 16 mmol/L (ref 0–100)
TCO2: 18 mmol/L (ref 0–100)
TCO2: 20 mmol/L (ref 0–100)
TCO2: 23 mmol/L (ref 0–100)
pCO2 arterial: 67.6 mmHg (ref 35.0–45.0)
pCO2 arterial: 49.7 mmHg — ABNORMAL HIGH (ref 35.0–45.0)
pH, Arterial: 6.928 — CL (ref 7.350–7.450)
pH, Arterial: 7.016 — CL (ref 7.350–7.450)
pH, Arterial: 7.111 — CL (ref 7.350–7.450)
pH, Arterial: 7.223 — ABNORMAL LOW (ref 7.350–7.450)
pO2, Arterial: 60 mmHg — ABNORMAL LOW (ref 80.0–100.0)
pO2, Arterial: 373 mmHg — ABNORMAL HIGH (ref 80.0–100.0)

## 2013-03-08 LAB — POCT ACTIVATED CLOTTING TIME
ACTIVATED CLOTTING TIME: 177 s
ACTIVATED CLOTTING TIME: 182 s
ACTIVATED CLOTTING TIME: 171 s
ACTIVATED CLOTTING TIME: 177 s
ACTIVATED CLOTTING TIME: 188 s
ACTIVATED CLOTTING TIME: 188 s
Activated Clotting Time: 143 seconds

## 2013-03-08 LAB — BASIC METABOLIC PANEL
BUN: 17 mg/dL (ref 6–23)
BUN: 19 mg/dL (ref 6–23)
BUN: 21 mg/dL (ref 6–23)
BUN: 20 mg/dL (ref 6–23)
CALCIUM: 7.8 mg/dL — AB (ref 8.4–10.5)
CHLORIDE: 104 meq/L (ref 96–112)
CHLORIDE: 106 meq/L (ref 96–112)
CO2: 15 meq/L — AB (ref 19–32)
CO2: 15 meq/L — AB (ref 19–32)
CO2: 15 meq/L — AB (ref 19–32)
CO2: 17 meq/L — AB (ref 19–32)
CREATININE: 1.29 mg/dL — AB (ref 0.50–1.10)
Calcium: 8.4 mg/dL (ref 8.4–10.5)
Calcium: 8.3 mg/dL — ABNORMAL LOW (ref 8.4–10.5)
Calcium: 8 mg/dL — ABNORMAL LOW (ref 8.4–10.5)
Chloride: 103 mEq/L (ref 96–112)
Chloride: 101 mEq/L (ref 96–112)
Creatinine, Ser: 1.15 mg/dL — ABNORMAL HIGH (ref 0.50–1.10)
Creatinine, Ser: 1.21 mg/dL — ABNORMAL HIGH (ref 0.50–1.10)
Creatinine, Ser: 1.3 mg/dL — ABNORMAL HIGH (ref 0.50–1.10)
GFR calc Af Amer: 69 mL/min — ABNORMAL LOW (ref >90)
GFR calc Af Amer: 64 mL/min — ABNORMAL LOW (ref >90)
GFR calc Af Amer: 60 mL/min — ABNORMAL LOW (ref >90)
GFR calc Af Amer: 59 mL/min — ABNORMAL LOW (ref >90)
GFR calc non Af Amer: 59 mL/min — ABNORMAL LOW (ref >90)
GFR calc non Af Amer: 56 mL/min — ABNORMAL LOW (ref >90)
GFR calc non Af Amer: 51 mL/min — ABNORMAL LOW (ref >90)
GFR calc non Af Amer: 51 mL/min — ABNORMAL LOW (ref >90)
GLUCOSE: 118 mg/dL — AB (ref 70–99)
GLUCOSE: 133 mg/dL — AB (ref 70–99)
Glucose, Bld: 133 mg/dL — ABNORMAL HIGH (ref 70–99)
Glucose, Bld: 170 mg/dL — ABNORMAL HIGH (ref 70–99)
POTASSIUM: 4.4 meq/L (ref 3.7–5.3)
Potassium: 4.6 mEq/L (ref 3.7–5.3)
Potassium: 5 mEq/L (ref 3.7–5.3)
Potassium: 4.6 mEq/L (ref 3.7–5.3)
SODIUM: 138 meq/L (ref 137–147)
Sodium: 138 mEq/L (ref 137–147)
Sodium: 138 mEq/L (ref 137–147)
Sodium: 140 mEq/L (ref 137–147)

## 2013-03-08 LAB — RENAL FUNCTION PANEL
ALBUMIN: 3 g/dL — AB (ref 3.5–5.2)
BUN: 21 mg/dL (ref 6–23)
CO2: 14 mEq/L — ABNORMAL LOW (ref 19–32)
Calcium: 8 mg/dL — ABNORMAL LOW (ref 8.4–10.5)
Chloride: 105 mEq/L (ref 96–112)
Creatinine, Ser: 1.34 mg/dL — ABNORMAL HIGH (ref 0.50–1.10)
GFR calc non Af Amer: 49 mL/min — ABNORMAL LOW (ref >90)
GFR, EST AFRICAN AMERICAN: 57 mL/min — AB (ref >90)
Glucose, Bld: 176 mg/dL — ABNORMAL HIGH (ref 70–99)
POTASSIUM: 5.1 meq/L (ref 3.7–5.3)
Phosphorus: 8.1 mg/dL — ABNORMAL HIGH (ref 2.3–4.6)
SODIUM: 141 meq/L (ref 137–147)

## 2013-03-08 LAB — GLUCOSE, CAPILLARY
Glucose-Capillary: 119 mg/dL — ABNORMAL HIGH (ref 70–99)
Glucose-Capillary: 93 mg/dL (ref 70–99)
Glucose-Capillary: 122 mg/dL — ABNORMAL HIGH (ref 70–99)
Glucose-Capillary: 98 mg/dL (ref 70–99)
Glucose-Capillary: 125 mg/dL — ABNORMAL HIGH (ref 70–99)
Glucose-Capillary: 90 mg/dL (ref 70–99)
Glucose-Capillary: 125 mg/dL — ABNORMAL HIGH (ref 70–99)
Glucose-Capillary: 98 mg/dL (ref 70–99)
Glucose-Capillary: 79 mg/dL (ref 70–99)

## 2013-03-08 LAB — CBC
HEMATOCRIT: 33.9 % — AB (ref 36.0–46.0)
HEMOGLOBIN: 10.8 g/dL — AB (ref 12.0–15.0)
MCH: 30.9 pg (ref 26.0–34.0)
MCHC: 31.9 g/dL (ref 30.0–36.0)
MCV: 96.9 fL (ref 78.0–100.0)
Platelets: 158 10*3/uL (ref 150–400)
RBC: 3.5 MIL/uL — ABNORMAL LOW (ref 3.87–5.11)
RDW: 16 % — ABNORMAL HIGH (ref 11.5–15.5)
WBC: 4.8 10*3/uL (ref 4.0–10.5)

## 2013-03-08 LAB — COMPREHENSIVE METABOLIC PANEL
ALBUMIN: 3.2 g/dL — AB (ref 3.5–5.2)
ALK PHOS: 69 U/L (ref 39–117)
ALT: 33 U/L (ref 0–35)
AST: 39 U/L — ABNORMAL HIGH (ref 0–37)
BILIRUBIN TOTAL: 0.6 mg/dL (ref 0.3–1.2)
BUN: 15 mg/dL (ref 6–23)
CHLORIDE: 99 meq/L (ref 96–112)
CO2: 16 mEq/L — ABNORMAL LOW (ref 19–32)
Calcium: 9.6 mg/dL (ref 8.4–10.5)
Creatinine, Ser: 0.95 mg/dL (ref 0.50–1.10)
GFR calc Af Amer: 86 mL/min — ABNORMAL LOW (ref >90)
GFR calc non Af Amer: 74 mL/min — ABNORMAL LOW (ref >90)
Glucose, Bld: 214 mg/dL — ABNORMAL HIGH (ref 70–99)
POTASSIUM: 3.6 meq/L — AB (ref 3.7–5.3)
Sodium: 137 mEq/L (ref 137–147)
Total Protein: 6.7 g/dL (ref 6.0–8.3)

## 2013-03-08 LAB — BLOOD GAS, ARTERIAL
Acid-base deficit: 11.6 mmol/L — ABNORMAL HIGH (ref 0.0–2.0)
Bicarbonate: 16.7 mEq/L — ABNORMAL LOW (ref 20.0–24.0)
DRAWN BY: 39866
FIO2: 1 %
LHR: 30 {breaths}/min
MECHVT: 400 mL
O2 SAT: 99.9 %
PEEP: 24 cmH2O
PO2 ART: 392 mmHg — AB (ref 80.0–100.0)
Patient temperature: 92
TCO2: 18.5 mmol/L (ref 0–100)
pCO2 arterial: 49.7 mmHg — ABNORMAL HIGH (ref 35.0–45.0)
pH, Arterial: 7.124 — CL (ref 7.350–7.450)

## 2013-03-08 LAB — APTT
APTT: 38 s — AB (ref 24–37)
aPTT: 34 seconds (ref 24–37)
aPTT: 49 seconds — ABNORMAL HIGH (ref 24–37)

## 2013-03-08 LAB — MRSA PCR SCREENING: MRSA BY PCR: NEGATIVE

## 2013-03-08 LAB — POCT I-STAT TROPONIN I: Troponin i, poc: 0.01 ng/mL (ref 0.00–0.08)

## 2013-03-08 LAB — PROTIME-INR
INR: 1.82 — ABNORMAL HIGH (ref 0.00–1.49)
INR: 2.02 — AB (ref 0.00–1.49)
Prothrombin Time: 20.5 seconds — ABNORMAL HIGH (ref 11.6–15.2)
Prothrombin Time: 22.2 seconds — ABNORMAL HIGH (ref 11.6–15.2)

## 2013-03-08 LAB — PROCALCITONIN: Procalcitonin: <0.1 ng/mL

## 2013-03-08 LAB — RAPID URINE DRUG SCREEN, HOSP PERFORMED
AMPHETAMINES: NOT DETECTED
Barbiturates: NOT DETECTED
Benzodiazepines: NOT DETECTED
COCAINE: NOT DETECTED
OPIATES: NOT DETECTED
Tetrahydrocannabinol: NOT DETECTED

## 2013-03-08 LAB — LACTIC ACID, PLASMA: LACTIC ACID, VENOUS: 6.9 mmol/L — AB (ref 0.5–2.2)

## 2013-03-08 LAB — CG4 I-STAT (LACTIC ACID): Lactic Acid, Venous: 8.01 mmol/L — ABNORMAL HIGH (ref 0.5–2.2)

## 2013-03-08 LAB — HCG, SERUM, QUALITATIVE: PREG SERUM: NEGATIVE

## 2013-03-08 MED ORDER — SODIUM CHLORIDE 0.9 % IJ SOLN
250.0000 [IU]/h | INTRAMUSCULAR | Status: DC
  Administered 2013-03-08: 250 [IU]/h via INTRAVENOUS_CENTRAL
  Administered 2013-03-09: 850 [IU]/h via INTRAVENOUS_CENTRAL
  Filled 2013-03-08 (×3): qty 2

## 2013-03-08 MED ORDER — HEPARIN SODIUM (PORCINE) 1000 UNIT/ML IJ SOLN
3000.0000 [IU] | Freq: Once | INTRAMUSCULAR | Status: AC
  Administered 2013-03-08: 3000 [IU] via INTRAVENOUS
  Filled 2013-03-08: qty 3

## 2013-03-08 MED ORDER — FENTANYL CITRATE 0.05 MG/ML IJ SOLN: 100.0000 ug | Freq: Once | INTRAMUSCULAR | Status: AC | PRN

## 2013-03-08 MED ORDER — MIDAZOLAM HCL 5 MG/ML IJ SOLN: 1.0000 mg/h | INTRAMUSCULAR | Status: DC

## 2013-03-08 MED ORDER — PRISMASOL BGK 4/2.5 32-4-2.5 MEQ/L IV SOLN
INTRAVENOUS | Status: DC
  Administered 2013-03-08 – 2013-03-10 (×9): via INTRAVENOUS_CENTRAL
  Filled 2013-03-08 (×12): qty 5000

## 2013-03-08 MED ORDER — FUROSEMIDE 10 MG/ML IJ SOLN
40.0000 mg | Freq: Every day | INTRAMUSCULAR | Status: DC
  Administered 2013-03-08: 40 mg via INTRAVENOUS

## 2013-03-08 MED ORDER — CHLORHEXIDINE GLUCONATE 0.12 % MT SOLN: 15.0000 mL | Freq: Two times a day (BID) | OROMUCOSAL | Status: DC

## 2013-03-08 MED ORDER — IOHEXOL 350 MG/ML SOLN
80.0000 mL | Freq: Once | INTRAVENOUS | Status: AC | PRN
  Administered 2013-03-08: 80 mL via INTRAVENOUS

## 2013-03-08 MED ORDER — FENTANYL CITRATE 0.05 MG/ML IJ SOLN: 100.0000 ug | Freq: Once | INTRAMUSCULAR | Status: DC

## 2013-03-08 MED ORDER — PANTOPRAZOLE SODIUM 40 MG IV SOLR
40.0000 mg | Freq: Every day | INTRAVENOUS | Status: DC
  Administered 2013-03-08 – 2013-03-10 (×3): 40 mg via INTRAVENOUS
  Filled 2013-03-08 (×3): qty 40

## 2013-03-08 MED ORDER — ARTIFICIAL TEARS OP OINT
1.0000 "application " | TOPICAL_OINTMENT | Freq: Three times a day (TID) | OPHTHALMIC | Status: DC
  Administered 2013-03-08 – 2013-03-09 (×6): 1 via OPHTHALMIC
  Filled 2013-03-08 (×3): qty 3.5

## 2013-03-08 MED ORDER — SODIUM CHLORIDE 0.9 % IV SOLN: 25.0000 ug/h | INTRAVENOUS | Status: DC

## 2013-03-08 MED ORDER — FENTANYL BOLUS VIA INFUSION: 50.0000 ug | INTRAVENOUS | Status: DC | PRN

## 2013-03-08 MED ORDER — SODIUM CHLORIDE 0.9 % IV SOLN: INTRAVENOUS | Status: DC

## 2013-03-08 MED ORDER — CISATRACURIUM BOLUS VIA INFUSION
0.0500 mg/kg | INTRAVENOUS | Status: DC | PRN
  Filled 2013-03-08: qty 6

## 2013-03-08 MED ORDER — ASPIRIN 300 MG RE SUPP
300.0000 mg | RECTAL | Status: AC
  Administered 2013-03-08: 300 mg via RECTAL
  Filled 2013-03-08: qty 1

## 2013-03-08 MED ORDER — INSULIN ASPART 100 UNIT/ML ~~LOC~~ SOLN
2.0000 [IU] | SUBCUTANEOUS | Status: DC
  Administered 2013-03-09: 2 [IU] via SUBCUTANEOUS

## 2013-03-08 MED ORDER — NOREPINEPHRINE BITARTRATE 1 MG/ML IJ SOLN: 0.5000 ug/min | INTRAVENOUS | Status: DC

## 2013-03-08 MED ORDER — DEXTROSE 5 % IV SOLN
0.5000 ug/min | INTRAVENOUS | Status: DC
  Filled 2013-03-08 (×2): qty 4

## 2013-03-08 MED ORDER — BIOTENE DRY MOUTH MT LIQD: 15.0000 mL | Freq: Four times a day (QID) | OROMUCOSAL | Status: DC

## 2013-03-08 MED ORDER — PRISMASOL BGK 4/2.5 32-4-2.5 MEQ/L IV SOLN
INTRAVENOUS | Status: DC
  Administered 2013-03-08 – 2013-03-10 (×2): via INTRAVENOUS_CENTRAL
  Filled 2013-03-08 (×6): qty 5000

## 2013-03-08 MED ORDER — CISATRACURIUM BOLUS VIA INFUSION
0.0500 mg/kg | INTRAVENOUS | Status: DC | PRN
  Filled 2013-03-08: qty 5

## 2013-03-08 MED ORDER — AMIODARONE HCL IN DEXTROSE 360-4.14 MG/200ML-% IV SOLN
60.0000 mg/h | INTRAVENOUS | Status: AC
  Administered 2013-03-08 (×2): 60 mg/h via INTRAVENOUS
  Filled 2013-03-08: qty 200

## 2013-03-08 MED ORDER — CALCIUM CHLORIDE 10 % IV SOLN
INTRAVENOUS | Status: AC | PRN
  Administered 2013-03-08: 1 g via INTRAVENOUS

## 2013-03-08 MED ORDER — NOREPINEPHRINE BITARTRATE 1 MG/ML IJ SOLN
0.5000 ug/min | INTRAVENOUS | Status: DC
  Administered 2013-03-08: 20 ug/min via INTRAVENOUS
  Administered 2013-03-08: 50 ug/min via INTRAVENOUS
  Administered 2013-03-09: 25 ug/min via INTRAVENOUS
  Administered 2013-03-09: 30 ug/min via INTRAVENOUS
  Administered 2013-03-10: 28 ug/min via INTRAVENOUS
  Filled 2013-03-08 (×5): qty 16

## 2013-03-08 MED ORDER — SODIUM CHLORIDE 0.9 % IV SOLN: 2000.0000 mL | Freq: Once | INTRAVENOUS | Status: DC

## 2013-03-08 MED ORDER — SODIUM CHLORIDE 0.9 % IV SOLN
25.0000 ug/h | INTRAVENOUS | Status: DC
  Administered 2013-03-08: 50 ug/h via INTRAVENOUS
  Administered 2013-03-09: 200 ug/h via INTRAVENOUS
  Filled 2013-03-08: qty 50

## 2013-03-08 MED ORDER — SODIUM CHLORIDE 0.9 % IV SOLN
1.0000 ug/kg/min | INTRAVENOUS | Status: DC
  Administered 2013-03-08: 1 ug/kg/min via INTRAVENOUS

## 2013-03-08 MED ORDER — MIDAZOLAM BOLUS VIA INFUSION: 2.0000 mg | INTRAVENOUS | Status: DC | PRN

## 2013-03-08 MED ORDER — CISATRACURIUM BOLUS VIA INFUSION
0.1000 mg/kg | Freq: Once | INTRAVENOUS | Status: DC
  Filled 2013-03-08: qty 9

## 2013-03-08 MED ORDER — SODIUM CHLORIDE 0.9 % IV SOLN
1.0000 ug/kg/min | INTRAVENOUS | Status: DC
  Filled 2013-03-08: qty 20

## 2013-03-08 MED ORDER — MIDAZOLAM BOLUS VIA INFUSION
2.0000 mg | INTRAVENOUS | Status: DC | PRN
  Filled 2013-03-08: qty 2

## 2013-03-08 MED ORDER — CISATRACURIUM BOLUS VIA INFUSION: 0.0500 mg/kg | INTRAVENOUS | Status: DC | PRN

## 2013-03-08 MED ORDER — EPINEPHRINE HCL 0.1 MG/ML IJ SOSY
PREFILLED_SYRINGE | INTRAMUSCULAR | Status: AC | PRN
  Administered 2013-03-08 (×2): 1 mg via INTRAVENOUS

## 2013-03-08 MED ORDER — SODIUM CHLORIDE 0.9 % IV SOLN
3.0000 g | Freq: Four times a day (QID) | INTRAVENOUS | Status: DC
  Administered 2013-03-08 – 2013-03-09 (×4): 3 g via INTRAVENOUS
  Filled 2013-03-08 (×6): qty 3

## 2013-03-08 MED ORDER — ATROPINE SULFATE 1 MG/ML IJ SOLN
INTRAMUSCULAR | Status: AC | PRN
  Administered 2013-03-08: 1 mg via INTRAVENOUS

## 2013-03-08 MED ORDER — MIDAZOLAM HCL 5 MG/ML IJ SOLN: 2.0000 mg | Freq: Once | INTRAMUSCULAR | Status: DC

## 2013-03-08 MED ORDER — SODIUM BICARBONATE 8.4 % IV SOLN
INTRAVENOUS | Status: AC | PRN
  Administered 2013-03-08: 50 meq via INTRAVENOUS

## 2013-03-08 MED ORDER — CISATRACURIUM BOLUS VIA INFUSION
0.1000 mg/kg | Freq: Once | INTRAVENOUS | Status: DC
  Administered 2013-03-08: 10.4 mg via INTRAVENOUS

## 2013-03-08 MED ORDER — NOREPINEPHRINE BITARTRATE 1 MG/ML IJ SOLN
2.0000 ug/min | Freq: Once | INTRAMUSCULAR | Status: AC
  Administered 2013-03-08: 5 ug/min via INTRAVENOUS

## 2013-03-08 MED ORDER — SODIUM CHLORIDE 0.9 % IV SOLN
1.0000 mg/h | INTRAVENOUS | Status: DC
  Administered 2013-03-08: 1 mg/h via INTRAVENOUS
  Administered 2013-03-09: 4 mg/h via INTRAVENOUS
  Administered 2013-03-09: 2 mg/h via INTRAVENOUS
  Filled 2013-03-08 (×2): qty 10

## 2013-03-08 MED ORDER — FENTANYL CITRATE 0.05 MG/ML IJ SOLN: 100.0000 ug | INTRAMUSCULAR | Status: DC | PRN

## 2013-03-08 MED ORDER — HEPARIN SODIUM (PORCINE) 1000 UNIT/ML DIALYSIS
1000.0000 [IU] | INTRAMUSCULAR | Status: DC | PRN
  Filled 2013-03-08: qty 6

## 2013-03-08 MED ORDER — SODIUM CHLORIDE 0.9 % IV SOLN
1.0000 ug/kg/min | INTRAVENOUS | Status: DC
  Administered 2013-03-08: 1 ug/kg/min via INTRAVENOUS
  Filled 2013-03-08 (×2): qty 20

## 2013-03-08 MED ORDER — MIDAZOLAM HCL 5 MG/ML IJ SOLN: 2.0000 mg | Freq: Once | INTRAMUSCULAR | Status: AC | PRN

## 2013-03-08 MED ORDER — SODIUM CHLORIDE 0.9 % FOR CRRT
INTRAVENOUS_CENTRAL | Status: DC | PRN
  Filled 2013-03-08: qty 1000

## 2013-03-08 MED ORDER — SODIUM CHLORIDE 0.9 % IV SOLN
2000.0000 mL | Freq: Once | INTRAVENOUS | Status: AC
  Administered 2013-03-08: 2000 mL via INTRAVENOUS

## 2013-03-08 MED ORDER — HEPARIN SOD (PORK) LOCK FLUSH 100 UNIT/ML IV SOLN
500.0000 [IU] | Freq: Once | INTRAVENOUS | Status: DC
  Filled 2013-03-08: qty 5

## 2013-03-08 MED ORDER — FUROSEMIDE 10 MG/ML IJ SOLN
INTRAMUSCULAR | Status: AC
  Filled 2013-03-08: qty 4

## 2013-03-08 MED ORDER — MIDAZOLAM HCL 5 MG/ML IJ SOLN: 2.0000 mg | Freq: Once | INTRAMUSCULAR | Status: DC | PRN

## 2013-03-08 MED ORDER — SODIUM CHLORIDE 0.9 % IV SOLN
INTRAVENOUS | Status: DC
  Administered 2013-03-09: 10 mL/h via INTRAVENOUS

## 2013-03-08 MED ORDER — EPINEPHRINE HCL 0.1 MG/ML IJ SOSY
PREFILLED_SYRINGE | INTRAMUSCULAR | Status: AC | PRN
  Administered 2013-03-08: 1 mg via INTRAVENOUS

## 2013-03-08 MED ORDER — HEPARIN BOLUS VIA INFUSION (CRRT)
1000.0000 [IU] | INTRAVENOUS | Status: DC | PRN
  Administered 2013-03-08: 1000 [IU] via INTRAVENOUS_CENTRAL
  Filled 2013-03-08: qty 1000

## 2013-03-08 MED ORDER — BIOTENE DRY MOUTH MT LIQD
15.0000 mL | Freq: Four times a day (QID) | OROMUCOSAL | Status: DC
  Administered 2013-03-08 – 2013-03-10 (×9): 15 mL via OROMUCOSAL

## 2013-03-08 MED ORDER — AMIODARONE HCL IN DEXTROSE 360-4.14 MG/200ML-% IV SOLN
30.0000 mg/h | INTRAVENOUS | Status: DC
  Administered 2013-03-09: 30 mg/h via INTRAVENOUS
  Filled 2013-03-08 (×5): qty 200

## 2013-03-08 MED ORDER — STERILE WATER FOR INJECTION IV SOLN
INTRAVENOUS | Status: DC
  Administered 2013-03-08 – 2013-03-09 (×3): via INTRAVENOUS_CENTRAL
  Filled 2013-03-08 (×8): qty 150

## 2013-03-08 MED ORDER — CHLORHEXIDINE GLUCONATE 0.12 % MT SOLN
15.0000 mL | Freq: Two times a day (BID) | OROMUCOSAL | Status: DC
  Administered 2013-03-08 – 2013-03-10 (×5): 15 mL via OROMUCOSAL
  Filled 2013-03-08 (×8): qty 15

## 2013-03-08 MED ORDER — SODIUM BICARBONATE 8.4 % IV SOLN
INTRAVENOUS | Status: DC
  Administered 2013-03-08: 12:00:00 via INTRAVENOUS
  Filled 2013-03-08 (×3): qty 150

## 2013-03-08 MED ORDER — FENTANYL BOLUS VIA INFUSION
50.0000 ug | INTRAVENOUS | Status: DC | PRN
  Filled 2013-03-08: qty 50

## 2013-03-08 MED FILL — Medication: Qty: 1 | Status: AC

## 2013-03-08 NOTE — Progress Notes (Signed)
Subjective: Events of earlier this morning noted Appreciate Dr. Myrtis SerKatz consult and help. Patient remains intubated sedated on paralytics and on artic sun cooling. Patient only seen in the hospital never followed up in the office.  Objective:  Vital Signs in the last 24 hours: Temp:  [90.5 F (32.5 C)-93.2 F (34 C)] 90.5 F (32.5 C) (01/06 1115) Pulse Rate:  [80-106] 80 (01/06 1100) Resp:  [0-26] 0 (01/06 1115) BP: (59-134)/(43-103) 128/82 mmHg (01/06 1115) SpO2:  [67 %-96 %] 67 % (01/06 1115) FiO2 (%):  [90 %-100 %] 100 % (01/06 1115) Weight:  [102.7 kg (226 lb 6.6 oz)-104.327 kg (230 lb)] 102.7 kg (226 lb 6.6 oz) (01/06 1115)  Intake/Output from previous day:   Intake/Output from this shift: Total I/O In: 900 [I.V.:900] Out: -   Physical Exam: Neck: no adenopathy, no carotid bruit, no JVD and supple, symmetrical, trachea midline Lungs: Decreased breath sound at bases with rhonchi and rales noted Heart: regular rate and rhythm, S1, S2 normal and 2/6 systolic murmur and S3 gallop noted Abdomen: soft, non-tender; bowel sounds normal; no masses,  no organomegaly Extremities: No clubbing cyanosis 4+ edema noted  Lab Results:  Recent Labs  03/04/2013 0801  WBC 4.8  HGB 10.8*  PLT 158    Recent Labs  03/13/2013 0801  NA 137  K 3.6*  CL 99  CO2 16*  GLUCOSE 214*  BUN 15  CREATININE 0.95   No results found for this basename: TROPONINI, CK, MB,  in the last 72 hours Hepatic Function Panel  Recent Labs  03/23/2013 0801  PROT 6.7  ALBUMIN 3.2*  AST 39*  ALT 33  ALKPHOS 69  BILITOT 0.6   No results found for this basename: CHOL,  in the last 72 hours No results found for this basename: PROTIME,  in the last 72 hours  Imaging: Imaging results have been reviewed and Ct Head Wo Contrast  03/18/2013   CLINICAL DATA:  Unresponsive.  EXAM: CT HEAD WITHOUT CONTRAST  TECHNIQUE: Contiguous axial images were obtained from the base of the skull through the vertex without  intravenous contrast.  COMPARISON:  08/17/2010.  FINDINGS: No mass. No hydrocephalus. No hemorrhage. Stable dural calcification. Orbits unremarkable. Mild mucosal thickening in the ethmoidal and sphenoid sinuses. Mastoids are clear. No acute bony abnormality.  IMPRESSION: No acute abnormality.   Electronically Signed   By: Maisie Fushomas  Register   On: 03/29/2013 10:36   Ct Angio Chest Pe W/cm &/or Wo Cm  03/09/2013   CLINICAL DATA:  Found unresponsive, fall cardiac arrest. History of atrial fibrillation deep venous thrombosis.  EXAM: CT ANGIOGRAPHY CHEST WITH CONTRAST  TECHNIQUE: Multidetector CT imaging of the chest was performed using the standard protocol during bolus administration of intravenous contrast. Multiplanar CT image reconstructions including MIPs were obtained to evaluate the vascular anatomy.  CONTRAST:  80 mL Omnipaque  COMPARISON:  DG CHEST 1V PORT dated 03/09/2013; CT ANGIO CHEST W/CM &/OR WO/CM dated 12/20/2012  FINDINGS: There are no filling defects within the pulmonary arteries to suggest acute pulmonary embolism. No acute findings of the aorta or great vessels. No pericardial fluid. Left ventricle is enlarged.  There is dense bibasilar consolidation with air bronchograms in following a large portion of the posterior aspect of the right and left lower lobes. There is patchy airspace disease in upper lobes.  No axillary supraclavicular lymphadenopathy. Endotracheal tube is in good position the trachea. NG tube extends to stomach.  Limited view of the upper abdomen is unremarkable.  Limited view of the skeleton is unremarkable.  Review of the MIP images confirms the above findings.  IMPRESSION: 1. No evidence acute pulmonary embolism. 2. Dense bilateral lower lobe consolidation represent a combination of atelectasis and airspace disease. 3. Extensive patchy airspace disease in the upper lobes may represent aspiration pneumonitis or pulmonary hemorrhage. 4. Endotracheal tube  appears in good position. 5.  No pericardial fluid. Findings conveyed Cloyd Stagers 03/23/2013  at11:00.   Electronically Signed   By: Genevive Bi M.D.   On: 03/31/2013 11:01   Dg Chest Portable 1 View  03/27/2013   CLINICAL DATA:  Cardiac arrest.  EXAM: PORTABLE CHEST - 1 VIEW  COMPARISON:  Two-view chest 01/09/2013.  FINDINGS: The heart is enlarged. Diffuse interstitial and airspace edema is present. The patient is intubated. The endotracheal tube terminates 4.2 cm above the chronic, in satisfactory position. A left subclavian line is in place. The tip is at or just above the cavoatrial junction. An NG tube courses off the inferior border of the film. A defibrillator or pacing pad is present over the heart. A small right pleural effusion is suspected. The visualized soft tissues and bony thorax are unremarkable.  IMPRESSION: 1. Stable cardiomegaly with progressive interstitial and airspace disease likely representing edema. 2. Satisfactory positioning of the support apparatus as described. 3. Probable right pleural effusion.   Electronically Signed   By: Gennette Pac M.D.   On: 03/06/2013 09:26    Cardiac Studies:  Assessment/Plan:  Status post V. fib/PE a cardiac arrest Status post emergent pericardiocentesis in ED History of recurrent A. 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  LOS: 0 days    Leeanna Slaby N 03/09/2013, 11:39 AM

## 2013-03-08 NOTE — ED Provider Notes (Signed)
CSN: [id number]     Arrival date & time 03/25/2013  0750 History   First MD Initiated Contact with Patient 03/28/2013 [phone]     Chief Complaint  Patient presents with  . Cardiac Arrest   (Consider location/radiation/quality/duration/timing/severity/associated sxs/prior Treatment) HPI Comments: 38 yo female presenting in CPR arrest.  EMS reported she was [place], [place], [place].  Unknown down time.  They found her in PEA arrest.  She was given several rounds of epi, as well as bicarb and calcium.  CPR performed for approximately 30 minutes PTA.    LEVEL V Caveat   Past Medical History  Diagnosis Date  . Hypertension   . Atrial fibrillation   . Lightheadedness   . Chest pain   . SOB (shortness of breath)   . Diaphoresis   . Nauseated   . Bipolar 1 disorder   . History of paranoid schizophrenia   . Multinodular goiter     BY THYROID ULTRASOUND 07/30/2010  . Hypothyroidism   . Anemia    Past Surgical History  Procedure Laterality Date  . Transthoracic echocardiogram  07/30/2010    Left ventricle: The cavity size was normal. Wall thickness was increased in a pattern of mild LVH. Systolic function was normal.  The estimated ejection fraction was in the range of 60% to 65%.  Rhae Hammock without cardioversion N/A 12/23/2012    Procedure: TRANSESOPHAGEAL ECHOCARDIOGRAM (TEE);  Surgeon: Ricki Rodriguez, MD;  Location: [facility] ENDOSCOPY;  Service: Cardiovascular;  Laterality: N/A;  INPATIENT AT [place] WILL RETURN TO [place] PER DOCTOR  BM   . Cardioversion N/A 12/23/2012    Procedure: CARDIOVERSION;  Surgeon: Ricki Rodriguez, MD;  Location: [facility] ENDOSCOPY;  Service: Cardiovascular;  Laterality: N/A;   Family History  Problem Relation Age of Onset  . Heart attack Mother   . Hypertension Mother    History  Substance Use Topics  . Smoking status: Former Smoker    Quit date: 08/05/1994  . Smokeless tobacco: Not on file  . Alcohol Use: No   OB History   Grav Para Term Preterm Abortions TAB  SAB Ect Mult Living                 Review of Systems  Unable to perform ROS   Allergies  Tetracyclines & related  Home Medications   Current Outpatient Rx  Name  Route  Sig  Dispense  Refill  . amiodarone (PACERONE) 200 MG tablet      Take one tab twice daily for 1 week then 1 tab daily for 1 week then stop   21 tablet   0   . diltiazem (CARDIZEM LA) 180 MG 24 hr tablet   Oral   Take 1 tablet (180 mg total) by mouth daily.   39 tablet   0   . furosemide (LASIX) 40 MG tablet   Oral   Take 1 tablet (40 mg total) by mouth daily.   30 tablet   0   . metoprolol tartrate (LOPRESSOR) 25 MG tablet   Oral   Take 1 tablet (25 mg total) by mouth 2 (two) times daily.   60 tablet   0   . Rivaroxaban (XARELTO) 20 MG TABS tablet   Oral   Take 1 tablet (20 mg total) by mouth daily with supper.   30 tablet   0    BP 59/43  Pulse 93 Physical Exam  Constitutional: She appears well-developed and well-nourished.  HENT:  Head: Atraumatic.  King airway in place  Eyes: No scleral icterus.  Pupils 4mm, not reactive  Neck: Normal range of motion. Neck supple.  Cardiovascular:  Central pulses palpable during compressions  Pulmonary/Chest:  Mechanical breath sounds audible bilaterally No spontaneous respiratory activity  Abdominal: Soft. She exhibits no distension.  Musculoskeletal:  Bilateral edema, worse on right  Neurological: She is unresponsive. GCS eye subscore is 1. GCS verbal subscore is 1. GCS motor subscore is 1.  Skin: No rash noted.    ED Course  CRITICAL CARE Performed by: Blake DivineWOFFORD, Konner Saiz DAVID Authorized by: Blake DivineWOFFORD, Juleah Paradise DAVID Total critical care time: 50 minutes Critical care time was exclusive of separately billable procedures and treating other patients. Critical care was necessary to treat or prevent imminent or life-threatening deterioration of the following conditions: cardiac failure and circulatory failure. Critical care was time spent personally  by me on the following activities: development of treatment plan with patient or surrogate, discussions with consultants, evaluation of patient's response to treatment, examination of patient, obtaining history from patient or surrogate, ordering and performing treatments and interventions, ordering and review of laboratory studies, ordering and review of radiographic studies, pulse oximetry, re-evaluation of patient's condition and review of old charts.  INTUBATION Date/Time: 03/31/2013 8:40 AM Performed by: Blake DivineWOFFORD, Juleen Sorrels DAVID Authorized by: Blake DivineWOFFORD, Halo Shevlin DAVID Consent: The procedure was performed in an emergent situation. Indications: respiratory failure and airway protection Intubation method: video-assisted Patient status: unconscious Preoxygenation: BVM (king airway) Laryngoscope size: glidescope 3. Tube size: 7.5 mm Tube type: cuffed Number of attempts: 1 Cricoid pressure: yes Cords visualized: yes Post-procedure assessment: CO2 detector Breath sounds: equal ETT to teeth: 24 cm Tube secured with: ETT holder  Pericardiocentesis Date/Time: 03/09/2013 8:10 AM Performed by: Blake DivineWOFFORD, Bernardo Brayman DAVID Authorized by: Blake DivineWOFFORD, Twilia Yaklin DAVID Consent: The procedure was performed in an emergent situation. Patient tolerance: Patient tolerated the procedure well with no immediate complications. Comments: Ultrasound guidance was used to locate pericardial effusion through sub xyphoid approach.  Site was prepped with betadine.  A 20g spinal needle was advanced via sub xyphoid approach under ultrasound guidance until needle was visualized to enter pericardial space.  ~13620mL of straw colored fluid was drained.  Needle was then removed.     Cardiopulmonary Resuscitation (CPR) Procedure Note Directed/Performed by: Warnell ForesterWOFFORD, TREY I personally directed ancillary staff and/or performed CPR in an effort to regain return of spontaneous circulation and to maintain cardiac, neuro and systemic perfusion.         (including critical care time) Labs Review All labs drawn in ED reviewed.   Imaging Review Ct Head Wo Contrast  03/07/2013   CLINICAL DATA:  Unresponsive.  EXAM: CT HEAD WITHOUT CONTRAST  TECHNIQUE: Contiguous axial images were obtained from the base of the skull through the vertex without intravenous contrast.  COMPARISON:  08/17/2010.  FINDINGS: No mass. No hydrocephalus. No hemorrhage. Stable dural calcification. Orbits unremarkable. Mild mucosal thickening in the ethmoidal and sphenoid sinuses. Mastoids are clear. No acute bony abnormality.  IMPRESSION: No acute abnormality.   Electronically Signed   By: Maisie Fushomas  Register   On: 03/04/2013 10:36   Ct Angio Chest Pe W/cm &/or Wo Cm  03/03/2013   CLINICAL DATA:  Found unresponsive, fall cardiac arrest. History of atrial fibrillation deep venous thrombosis.  EXAM: CT ANGIOGRAPHY CHEST WITH CONTRAST  TECHNIQUE: Multidetector CT imaging of the chest was performed using the standard protocol during bolus administration of intravenous contrast. Multiplanar CT image reconstructions including MIPs were obtained to evaluate the vascular anatomy.  CONTRAST:  80 mL Omnipaque  COMPARISON:  DG CHEST 1V PORT dated 03/29/2013; CT ANGIO CHEST W/CM &/OR WO/CM dated 12/20/2012  FINDINGS: There are no filling defects within the pulmonary arteries to suggest acute pulmonary embolism. No acute findings of the aorta or great vessels. No pericardial fluid. Left ventricle is enlarged.  There is dense bibasilar consolidation with air bronchograms in following a large portion of the posterior aspect of the right and left lower lobes. There is patchy airspace disease in upper lobes.  No axillary supraclavicular lymphadenopathy. Endotracheal tube is in good position the trachea. NG tube extends to stomach.  Limited view of the upper abdomen is unremarkable. Limited view of the skeleton is unremarkable.  Review of the MIP images confirms the above findings.  IMPRESSION: 1.  No evidence acute pulmonary embolism. 2. Dense bilateral lower lobe consolidation represent a combination of atelectasis and airspace disease. 3. Extensive patchy airspace disease in the upper lobes may represent aspiration pneumonitis or pulmonary hemorrhage. 4. Endotracheal tube  appears in good position. 5. No pericardial fluid. Findings conveyed Cloyd Stagers 03/25/2013  at11:00.   Electronically Signed   By: Genevive Bi M.D.   On: 03/03/2013 11:01   Dg Chest Port 1 View  03/21/2013   CLINICAL DATA:  Hemodialysis catheter placement  EXAM: PORTABLE CHEST - 1 VIEW  COMPARISON:  CT ANGIO CHEST W/CM &/OR WO/CM dated 03/21/2013; DG CHEST 1V PORT dated 03/09/2013  FINDINGS: Interval placement of a large-bore right central venous line with tip in the mid SVC. No pneumothorax. Left central venous line and NG tube are unchanged. Endotracheal to is 3.7 cm from carina in good position.  There is dense bibasilar airspace disease again demonstrated  IMPRESSION: 1. Interval placement of a large-bore right central venous line with tip in the mid SVC. No pneumothorax. 2. Additional support apparatus is stable. 3. Dense bibasilar airspace disease   Electronically Signed   By: Genevive Bi M.D.   On: 03/17/2013 15:23   Dg Chest Portable 1 View  03/12/2013   CLINICAL DATA:  Cardiac arrest.  EXAM: PORTABLE CHEST - 1 VIEW  COMPARISON:  Two-view chest 01/09/2013.  FINDINGS: The heart is enlarged. Diffuse interstitial and airspace edema is present. The patient is intubated. The endotracheal tube terminates 4.2 cm above the chronic, in satisfactory position. A left subclavian line is in place. The tip is at or just above the cavoatrial junction. An NG tube courses off the inferior border of the film. A defibrillator or pacing pad is present over the heart. A small right pleural effusion is suspected. The visualized soft tissues and bony thorax are unremarkable.  IMPRESSION: 1. Stable cardiomegaly with progressive  interstitial and airspace disease likely representing edema. 2. Satisfactory positioning of the support apparatus as described. 3. Probable right pleural effusion.   Electronically Signed   By: Gennette Pac M.D.   On: 03/16/2013 09:26  All radiology studies independently viewed by me.     EKG Interpretation    Date/Time:    Ventricular Rate:    PR Interval:    QRS Duration:   QT Interval:    QTC Calculation:   R Axis:     Text Interpretation:              MDM   1. Cardiac arrest   2. Acute respiratory failure   3. Pericardial effusion with tamponade  38 yo female with hx of a fib and schizophrenia presenting in PEA arrest.  Given multiple rounds of  meds without significant response prior to arrival.  After a round of CPR and additional Epi, bicarb, and calcium, she regained faint pulses.  Bedside ultrasound showed a large pericardial effusion with clear signs of right ventricular collapse and tamponade.  Her SBP at this time was in the 50's.  She had pronounced JVD.  A bedside, emergent pericardiocentesis was performed by me under ultrasound guidance and ~120 cc of straw colored fluid were drawn off.  Blood pressure then improved and echo showed improved cardiac function.  She was then intubated.  After intubation, she briefly lost pulses again, which improved with a round of CPR and epinephrine.  Critical Care and Cardiology were consulted.  She will be admitted to the ICU and possibly have a pericardial drained placed.      Candyce Churn, MD 03/19/2013 [phone]

## 2013-03-08 NOTE — ED Notes (Signed)
PCCM at the bedside 

## 2013-03-08 NOTE — Progress Notes (Signed)
Pt intubated by ED MD post CPR. Pt is tolerating well on the vent at this time. No complications noted. RT will monitor.

## 2013-03-08 NOTE — Procedures (Signed)
Central Venous Catheter hemodialysis Insertion Procedure Note Ashley Jones [id number] [birth year]  Procedure: Insertion of Central Venous Catheter Indications: hemodialysis   Procedure Details Consent: Unable to obtain consent because of emergent medical necessity. Time Out: Verified patient identification, verified procedure, site/side was marked, verified correct patient position, special equipment/implants available, medications/allergies/relevent history reviewed, required imaging and test results available.  Performed Real time [place] used to ID and cannulate the vessel  Maximum sterile technique was used including antiseptics, cap, gloves, gown, hand hygiene, mask and sheet. Skin prep: Chlorhexidine; local anesthetic administered A antimicrobial bonded/coated triple lumen catheter was placed in the right internal jugular vein using the Seldinger technique.  Evaluation Blood flow good Complications: No apparent complications Patient did tolerate procedure well. Chest X-ray ordered to verify placement.  CXR: pending.  BABCOCK,PETE 03/29/2013, 2:15 PM  Supervised and present through the entire procedure.  Lonia Farber, MD Pulmonary and Critical Care Medicine [facility] Pager: [phone]

## 2013-03-08 NOTE — Progress Notes (Signed)
Utilization Review Completed.Ashley Jones T1/08/2013

## 2013-03-08 NOTE — Code Documentation (Signed)
Compressions started.

## 2013-03-08 NOTE — Consult Note (Signed)
[facility]  Patient name: Ashley Jones Medical record number: [id number] Date of birth: Jun 18, 1975 Age: 38 y.o. Gender: female  Primary Care Provider: No PCP Per Patient  Consult: anuric state s/p cardiac arrest   History of Present Illness: 38 yo female with AF, DVT and systolic heart failure (EF 45-50%). Admitted 1/6 after being found down on sidewalk (downtime unknown). Initial rhythm VF, shocked several times, and ACLS algorithm carried out. PEA on arrival to ER. ROSC in ER estimated 12 minutes. Caveat V, currently receiving versed, fentanyl, nimbex and levophed. Intubated. Hypothermia protocol. VSS declining.    Review Of Systems: Per HPI with the following additions: Caveat V Otherwise 12 point review of systems was performed and was unremarkable.  Patient Active Problem List   Diagnosis Date Noted  . Cardiac arrest 03-20-2013  . Acute respiratory failure 03-20-2013  . Chronic systolic CHF (congestive heart failure) 03/20/2013  . Ventricular fibrillation 03/20/2013  . Mitral regurgitation 2013/03/20  . Right ventricular dysfunction Mar 20, 2013  . Acute systolic congestive heart failure 12/23/2012  . Pleural effusion, right 12/23/2012  . Hypokalemia 12/23/2012  . Hypomagnesemia 12/23/2012  . History of DVT (deep vein thrombosis) 12/21/2012  . Mastitis, acute, possible inflammatory breast CA? 12/21/2012  . Atrial fibrillation with rapid ventricular response, paroxysmal 05/11/2012  . Near syncope 05/11/2012  . Hypothyroid 05/11/2012   Past Medical History: Past Medical History  Diagnosis Date  . Hypertension   . Atrial fibrillation   . Lightheadedness   . Chest pain   . SOB (shortness of breath)   . Diaphoresis   . Nauseated   . Bipolar 1 disorder   . History of paranoid schizophrenia   . Multinodular goiter     BY THYROID ULTRASOUND 07/30/2010  . Hypothyroidism   . Anemia    Past Surgical History: Past Surgical History  Procedure Laterality  Date  . Transthoracic echocardiogram  07/30/2010    Left ventricle: The cavity size was normal. Wall thickness was increased in a pattern of mild LVH. Systolic function was normal.  The estimated ejection fraction was in the range of 60% to 65%.  Rhae Hammock without cardioversion N/A 12/23/2012    Procedure: TRANSESOPHAGEAL ECHOCARDIOGRAM (TEE);  Surgeon: Ricki Rodriguez, MD;  Location: [facility] ENDOSCOPY;  Service: Cardiovascular;  Laterality: N/A;  INPATIENT AT [place] WILL RETURN TO [place] PER DOCTOR  BM   . Cardioversion N/A 12/23/2012    Procedure: CARDIOVERSION;  Surgeon: Ricki Rodriguez, MD;  Location: [facility] ENDOSCOPY;  Service: Cardiovascular;  Laterality: N/A;   Social History: History  Substance Use Topics  . Smoking status: Former Smoker    Quit date: 08/05/1994  . Smokeless tobacco: Not on file  . Alcohol Use: No   Additional social history: unknown Please also refer to relevant sections of EMR.  Family History: Family History  Problem Relation Age of Onset  . Heart attack Mother   . Hypertension Mother    Allergies and Medications: Allergies  Allergen Reactions  . Tetracyclines & Related Hives   No current facility-administered medications on file prior to encounter.   Current Outpatient Prescriptions on File Prior to Encounter  Medication Sig Dispense Refill  . amiodarone (PACERONE) 200 MG tablet Take one tab twice daily for 1 week then 1 tab daily for 1 week then stop  21 tablet  0  . diltiazem (CARDIZEM LA) 180 MG 24 hr tablet Take 1 tablet (180 mg total) by mouth daily.  39 tablet  0  . furosemide (  LASIX) 40 MG tablet Take 1 tablet (40 mg total) by mouth daily.  30 tablet  0  . metoprolol tartrate (LOPRESSOR) 25 MG tablet Take 1 tablet (25 mg total) by mouth 2 (two) times daily.  60 tablet  0  . Rivaroxaban (XARELTO) 20 MG TABS tablet Take 1 tablet (20 mg total) by mouth daily with supper.  30 tablet  0    Objective: BP 89/45  Pulse 80  Temp(Src) 91.8 F (33.2 C)  (Core (Comment))  Resp 0  Ht 5' 9.5" (1.765 m)  Wt 226 lb 6.6 oz (102.7 kg)  BMI 32.97 kg/m2  SpO2 56%   Intake/Output Summary (Last 24 hours) at 03/12/2013 1418 Last data filed at 03/07/2013 1400  Gross per 24 hour  Intake 1674.83 ml  Output      0 ml  Net 1674.83 ml   Exam: General: Intubated and sedated Cardiovascular: RRR.  Respiratory: Intubated. CTAB.  Abdomen: + BS, Soft. ND. No masses.  Extremities: No erythema, trace edema.  Neuro: Sedated. Intubated.   Recent Labs Lab 03/30/2013 0801  HGB 10.8*  HCT 33.9*  WBC 4.8  PLT 158    Recent Labs Lab 03/03/2013 0801 03/24/2013 1100 03/09/2013 1300  NA 137 138 138  K 3.6* 4.4 4.6  CL 99 103 104  CO2 16* 15* 15*  GLUCOSE 214* 118* 133*  BUN 15 17 19   CREATININE 0.95 1.15* 1.21*  CALCIUM 9.6 8.4 8.3*    Ct Head Wo Contrast  03/09/2013   CLINICAL DATA:  Unresponsive.  EXAM: CT HEAD WITHOUT CONTRAST  TECHNIQUE: Contiguous axial images were obtained from the base of the skull through the vertex without intravenous contrast.  COMPARISON:  08/17/2010.  FINDINGS: No mass. No hydrocephalus. No hemorrhage. Stable dural calcification. Orbits unremarkable. Mild mucosal thickening in the ethmoidal and sphenoid sinuses. Mastoids are clear. No acute bony abnormality.  IMPRESSION: No acute abnormality.   Electronically Signed   By: Maisie Fus  Register   On: 03/23/2013 10:36   Ct Angio Chest Pe W/cm &/or Wo Cm  03/29/2013   CLINICAL DATA:  Found unresponsive, fall cardiac arrest. History of atrial fibrillation deep venous thrombosis.  EXAM: CT ANGIOGRAPHY CHEST WITH CONTRAST  TECHNIQUE: Multidetector CT imaging of the chest was performed using the standard protocol during bolus administration of intravenous contrast. Multiplanar CT image reconstructions including MIPs were obtained to evaluate the vascular anatomy.  CONTRAST:  80 mL Omnipaque  COMPARISON:  DG CHEST 1V PORT dated 03/05/2013; CT ANGIO CHEST W/CM &/OR WO/CM dated 12/20/2012  FINDINGS:  There are no filling defects within the pulmonary arteries to suggest acute pulmonary embolism. No acute findings of the aorta or great vessels. No pericardial fluid. Left ventricle is enlarged.  There is dense bibasilar consolidation with air bronchograms in following a large portion of the posterior aspect of the right and left lower lobes. There is patchy airspace disease in upper lobes.  No axillary supraclavicular lymphadenopathy. Endotracheal tube is in good position the trachea. NG tube extends to stomach.  Limited view of the upper abdomen is unremarkable. Limited view of the skeleton is unremarkable.  Review of the MIP images confirms the above findings.  IMPRESSION: 1. No evidence acute pulmonary embolism. 2. Dense bilateral lower lobe consolidation represent a combination of atelectasis and airspace disease. 3. Extensive patchy airspace disease in the upper lobes may represent aspiration pneumonitis or pulmonary hemorrhage. 4. Endotracheal tube  appears in good position. 5. No pericardial fluid. Findings conveyed Cloyd Stagers 03/13/2013  at11:00.   Electronically Signed   By: Genevive BiStewart  Edmunds M.D.   On: 2013-07-30 11:01   Dg Chest Portable 1 View  03/29/2013   CLINICAL DATA:  Cardiac arrest.  EXAM: PORTABLE CHEST - 1 VIEW  COMPARISON:  Two-view chest 01/09/2013.  FINDINGS: The heart is enlarged. Diffuse interstitial and airspace edema is present. The patient is intubated. The endotracheal tube terminates 4.2 cm above the chronic, in satisfactory position. A left subclavian line is in place. The tip is at or just above the cavoatrial junction. An NG tube courses off the inferior border of the film. A defibrillator or pacing pad is present over the heart. A small right pleural effusion is suspected. The visualized soft tissues and bony thorax are unremarkable.  IMPRESSION: 1. Stable cardiomegaly with progressive interstitial and airspace disease likely representing edema. 2. Satisfactory  positioning of the support apparatus as described. 3. Probable right pleural effusion.   Electronically Signed   By: Gennette Pachris  Mattern M.D.   On: 2013-07-30 09:26    Assessment: 1.) Acute renal failure sec ischemic ATN +/- contrast 2.) CHF 3). Pulmonary Edema 4.) A.fib 5.) Metabolic and resp Acidosis 6.) Hypothermia    Plan:  1.) HD cath placed today. CVVHD orders placed. Acute renal failure, likely ATN etiology from acute issues. In addition she is s/p CT contrast.  2.) renal function BID 3.) Close monitoring of electrolytes (K, Mg, Ca, Phos) with replacement  Natalia Leatherwoodenee A Kuneff, DO 03/16/2013, 2:06 PM PGY-2, [facility] I have seen and examined this patient and agree with plan per Dr Claiborne BillingsKuneff.  37yo BF found down and in V fib.  CT angio neg for PE.  She is anuric with significant acidosis and in pulm edema requiring multiple drips.  Asked to see pt for CRRT.  Will start CRRT and cont IV bicarb for now.  Her prognosis is very poor.  Cohick T,MD 03/04/2013 3:02 PM

## 2013-03-08 NOTE — Code Documentation (Signed)
Pulse check with [place]. Dr. Loretha Stapler at the bedside. Compressions resumed.

## 2013-03-08 NOTE — Progress Notes (Signed)
Vent changes made per MD order. Pt now on ARDS protocol with a VT of 6c's/kg. Recruitment maneuver performed as well. RT will monitor,

## 2013-03-08 NOTE — H&P (Signed)
PULMONARY / CRITICAL CARE MEDICINE   Name: Lesette Frary MRN: [id number] DOB: 1975-12-01    ADMISSION DATE:  03/03/2013 CONSULTATION DATE:  03/26/2013  REFERRING MD :  EDP PRIMARY SERVICE: PCCM  CHIEF COMPLAINT:  Cardiac arrest  BRIEF PATIENT DESCRIPTION:  38 yo with AF, DVT and systolic heart failure (EF 45-50%). Admitted 1/6 after being found down on sidewalk (downtime unknown). Initial rhythm VF, shocked several times, and ACLS algorithm carried out. PEA on arrival to ER. ROSC in ER estimated 12 minutes.  SIGNIFICANT EVENTS / STUDIES:  1/6  Found down, VF, unknown down time. ROSC in ER 12 minutes 1/6  Pericardiocentesis >>> 115 ml serous fluid 1/6  Head CT >>> nad 1/6  TTE >>> No significant pericardial effusion suggesting pericardial effusion and tamponade was not etiology of arrest, RV dysfxn and LV dsyfxn 1/6  CT angio >>> 1/6  Hypothermia protocol started: 0920 AM 1/6  RLE US>>>  LINES / TUBES: OETT 1/6 >>> OGT 1/6 >>> L [place] CVL 1/6 >>>  CULTURES: 1/6  Sputum >>> 1/6  Blood >>>  ANTIBIOTICS: Unasyn 1/6 >>>  The patient is encephalopathic and unable to provide history, which was obtained for available medical records.  HISTORY OF PRESENT ILLNESS:  38 yo with AF, DVT and systolic heart failure (EF 45-50%). Admitted 1/6 after being found down on sidewalk (downtime unknown). Initial rhythm VF, shocked several times, and ACLS algorithm carried out. PEA on arrival to ER. ROSC in ER estimated 12 minutes.  PAST MEDICAL HISTORY :  Past Medical History  Diagnosis Date  . Hypertension   . Atrial fibrillation   . Lightheadedness   . Chest pain   . SOB (shortness of breath)   . Diaphoresis   . Nauseated   . Bipolar 1 disorder   . History of paranoid schizophrenia   . Multinodular goiter     BY THYROID ULTRASOUND 07/30/2010  . Hypothyroidism   . Anemia    Past Surgical History  Procedure Laterality Date  . Transthoracic echocardiogram  07/30/2010    Left ventricle:  The cavity size was normal. Wall thickness was increased in a pattern of mild LVH. Systolic function was normal.  The estimated ejection fraction was in the range of 60% to 65%.  Rhae Hammock without cardioversion N/A 12/23/2012    Procedure: TRANSESOPHAGEAL ECHOCARDIOGRAM (TEE);  Surgeon: Ricki Rodriguez, MD;  Location: [facility] ENDOSCOPY;  Service: Cardiovascular;  Laterality: N/A;  INPATIENT AT [place] WILL RETURN TO [place] PER DOCTOR  BM   . Cardioversion N/A 12/23/2012    Procedure: CARDIOVERSION;  Surgeon: Ricki Rodriguez, MD;  Location: [facility] ENDOSCOPY;  Service: Cardiovascular;  Laterality: N/A;   Prior to Admission medications   Medication Sig Start Date End Date Taking? Authorizing Provider  amiodarone (PACERONE) 200 MG tablet Take one tab twice daily for 1 week then 1 tab daily for 1 week then stop 01/11/13   Jeralyn Bennett, MD  diltiazem (CARDIZEM LA) 180 MG 24 hr tablet Take 1 tablet (180 mg total) by mouth daily. 01/11/13   Jeralyn Bennett, MD  furosemide (LASIX) 40 MG tablet Take 1 tablet (40 mg total) by mouth daily. 01/11/13   Jeralyn Bennett, MD  metoprolol tartrate (LOPRESSOR) 25 MG tablet Take 1 tablet (25 mg total) by mouth 2 (two) times daily. 01/11/13   Jeralyn Bennett, MD  Rivaroxaban (XARELTO) 20 MG TABS tablet Take 1 tablet (20 mg total) by mouth daily with supper. 01/11/13   Jeralyn Bennett, MD  Allergies  Allergen Reactions  . Tetracyclines & Related Hives   FAMILY HISTORY:  Family History  Problem Relation Age of Onset  . Heart attack Mother   . Hypertension Mother    SOCIAL HISTORY:  reports that she quit smoking about 18 years ago. She does not have any smokeless tobacco history on file. She reports that she does not drink alcohol or use illicit drugs.  REVIEW OF SYSTEMS:  Unable   INTERVAL HISTORY:   VITAL SIGNS: Temp:  [93.2 F (34 C)] 93.2 F (34 C) (01/06 0901) Pulse Rate:  [93-106] 102 (01/06 0900) Resp:  [14-15] 14 (01/06 0900) BP: (59-132)/(43-103)  124/91 mmHg (01/06 0900) SpO2:  [93 %-96 %] 95 % (01/06 0900) FiO2 (%):  [100 %] 100 % (01/06 0838)  HEMODYNAMICS:   VENTILATOR SETTINGS: Vent Mode:  [-]  FiO2 (%):  [100 %] 100 % INTAKE / OUTPUT: Intake/Output     01/05 0701 - 01/06 0700 01/06 0701 - 01/07 0700   I.V.  900   Total Intake   900   Net   +900         PHYSICAL EXAMINATION: General:  Chronically ill appearing female, appears older than stated age.  Neuro: GCS 3 HEENT: orally intubated. Frothy blood tinged secretions  Cardiovascular:  Distant rrr Lungs:  Scattered rhonchi  Abdomen:  Soft, non-tender  Musculoskeletal:  RLE > LLE edema. IO in RLE, Skin:   chronic stasis changes   LABS:  CBC  Recent Labs Lab 03/22/2013 0801  WBC 4.8  HGB 10.8*  HCT 33.9*  PLT 158   Coag's  Recent Labs Lab 04/02/2013 0801  APTT 34   BMET  Recent Labs Lab 03/05/2013 0801  NA 137  K 3.6*  CL 99  CO2 16*  BUN 15  CREATININE 0.95  GLUCOSE 214*   Electrolytes  Recent Labs Lab 03/06/2013 0801  CALCIUM 9.6   Sepsis Markers  Recent Labs Lab 03/07/2013 0807  LATICACIDVEN 8.01*   ABG No results found for this basename: PHART, PCO2ART, PO2ART,  in the last 168 hours Liver Enzymes  Recent Labs Lab 03/05/2013 0801  AST 39*  ALT 33  ALKPHOS 69  BILITOT 0.6  ALBUMIN 3.2*   Cardiac Enzymes No results found for this basename: TROPONINI, PROBNP,  in the last 168 hours Glucose No results found for this basename: GLUCAP,  in the last 168 hours Imaging No results found.  CXR: 1/6 >>> hardware in good position, diffuse airspace  ASSESSMENT / PLAN:  PULMONARY A:  Acute respiratory failure post cardiac arrest. Acute pulmonary edema, less likely ARDS. Concern for possible PE, doubt as on Xarelto and alternative explanations for hypoxia / shock. P:   Goal pH>7.30, SpO2>92 Continuous mechanical support, high Ve VAP bundle Daily SBT Trend ABG/CXR Chest CTA  CARDIOVASCULAR A:   VF/PEA arrest.  Acute on  chronic systolic congestive heart failure. Shock, likely cardiogenic. H/o VTE. P:  Cardiology consulted in ED Goal MAP>60 Trend troponin / lactate Levophed gtt May need PA cath Amiodarone gtt  RENAL A:   Anuria. Likely AKI. Severe metabolic acidosis. P:   Renal consulted Trend BMP Place HD catheter CVVHD D/c Lasix Bicarbonate gtt@100   GASTROINTESTINAL A:   Nutrition. GI Px. 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 Anemia. P:  Trend CBC Hold Xarelto Heparin per Pharmacy  INFECTIOUS A:   Suspected aspiration. P:   Empiric  Unasyn   ENDOCRINE A:   Hyperglycemia.  P:   SSI  NEUROLOGIC A:   Acute encephalopathy. Likely anoxia. P:   Hypothermia protocol  Fentanyl / Versed / Nimbex Hold WUA  I have personally obtained a history, examined the patient, evaluated laboratory and imaging results, formulated the assessment and plan and placed orders.  CRITICAL CARE: The patient is critically ill with multiple organ systems failure and requires high complexity decision making for assessment and support, frequent evaluation and titration of therapies, application of advanced monitoring technologies and extensive interpretation of multiple databases. Critical Care Time devoted to patient care services described in this note is ---minutes.   Lonia Farber, MD Pulmonary and Critical Care Medicine [facility] Pager: [phone]  2013/03/23, 9:11 AM

## 2013-03-08 NOTE — Code Documentation (Signed)
Pulse check, return of pulses.

## 2013-03-08 NOTE — Code Documentation (Signed)
Pt arrived on the lucas, remains in a rhythm of PEA. EDP at the bedside.

## 2013-03-08 NOTE — ED Notes (Signed)
Cardiology at the bedside. Echo tech at the bedside

## 2013-03-08 NOTE — ED Notes (Signed)
115mg  of fluid pulled from centesis.

## 2013-03-08 NOTE — Code Documentation (Signed)
Pulse returned

## 2013-03-08 NOTE — ED Notes (Signed)
Pt to department via EMS- pt found down on a sidewalk in [place]. EMS started compressions. Pt noted to be in v-fib, shocked 4 times. Given 5 epis, bicarb, and calcium in route. Pt arrived in PEA on the lucas. King airway in place, IO to right leg.

## 2013-03-08 NOTE — ED Notes (Signed)
PCCM at the bedside to place a central line.  

## 2013-03-08 NOTE — ED Notes (Signed)
80 Bp palpated

## 2013-03-08 NOTE — Progress Notes (Signed)
DR Z (CCM) notified of decreasing BP, Increasing pressor rates, and sats in the 50's. Md arrived at bedside to assess the patient. Will continue to monitor.

## 2013-03-08 NOTE — Progress Notes (Signed)
  Echocardiogram 2D Echocardiogram (stat) has been performed.  Ashley Jones 03/09/2013, 9:12 AM

## 2013-03-08 NOTE — Progress Notes (Signed)
ANTIBIOTIC CONSULT NOTE - INITIAL  Pharmacy Consult for Unasyn Indication: possible asp pna  Allergies  Allergen Reactions  . Tetracyclines & Related Hives    Patient Measurements: Height: 5' 9.5" (176.5 cm) Weight: 230 lb (104.327 kg) IBW/kg (Calculated) : 67.35  Vital Signs: Temp: 93.2 F (34 C) (01/06 0901) Temp src: Core (Comment) (01/06 0901) BP: 124/91 mmHg (01/06 0900) Pulse Rate: 102 (01/06 0900) Intake/Output from previous day:   Intake/Output from this shift: Total I/O In: 900 [I.V.:900] Out: -   Labs:  Recent Labs  03/14/2013 0801  WBC 4.8  HGB 10.8*  PLT 158  CREATININE 0.95   Estimated Creatinine Clearance: 103.2 ml/min (by C-G formula based on Cr of 0.95). No results found for this basename: VANCOTROUGH, VANCOPEAK, VANCORANDOM, GENTTROUGH, GENTPEAK, GENTRANDOM, TOBRATROUGH, TOBRAPEAK, TOBRARND, AMIKACINPEAK, AMIKACINTROU, AMIKACIN,  in the last 72 hours   Microbiology: No results found for this or any previous visit (from the past 720 hour(s)).  Medical History: Past Medical History  Diagnosis Date  . Hypertension   . Atrial fibrillation   . Lightheadedness   . Chest pain   . SOB (shortness of breath)   . Diaphoresis   . Nauseated   . Bipolar 1 disorder   . History of paranoid schizophrenia   . Multinodular goiter     BY THYROID ULTRASOUND 07/30/2010  . Hypothyroidism   . Anemia     Medications:  Await electronic home med rec  Assessment: 37 y.o. female presents after being found down on sidewalk in [place]. EMS noted pt to be in vfib - shocked by EMS. Arrived at ED in PEA arrest. Hypothermia protocol initiated. To begin Unasyn (Day #1) for possible asp pna. Wbc wnl. LA 8.01. SCr ok at baseline, CrCl ~ 145ml/min.  Goal of Therapy:  Appropriate abx dosing  Plan:  1. Unasyn 3g IV q6h. 2. Will f/u micro data, pt's clinical condition, renal function  Christoper Fabian, PharmD, BCPS Clinical pharmacist, pager [phone] 03/23/2013,9:45 AM

## 2013-03-08 NOTE — ED Notes (Signed)
Lactic acid results shown to nurse Ivory Broad and told to Dr. Loretha Stapler

## 2013-03-08 NOTE — Progress Notes (Signed)
INITIAL NUTRITION ASSESSMENT  DOCUMENTATION CODES Per approved criteria  -Obesity unspecified   INTERVENTION: 1.  Enteral nutrition; once rewarmed, recommend initiate Vital HP @ 10 mL/hr continuous.  Advance by 10 mL q 8 hrs to 65 mL/hr goal to provide 1560 kcal, 144g protein, 1304 mL free water. 2.  Monitor magnesium, potassium, and phosphorus daily for at least 3 days, MD to replete as needed, as pt is at risk for refeeding syndrome given unknown wt and social hx.  NUTRITION DIAGNOSIS: Inadequate oral intake related to inability to eat as evidenced by NPO, vent.   Monitor:  1.  Enteral nutrition; initiation with tolerance.  Enteral nutrition to provide 60-70% of estimated calorie needs (22-25 kcals/kg ideal body weight) and 100% of estimated protein needs, based on ASPEN guidelines for permissive underfeeding in critically ill obese individuals. 2.  Wt/wt change; monitor trends  Reason for Assessment: Vent  38 y.o. female  Admitting Dx: cardiac arrest  ASSESSMENT: Pt admitted with cardiac arrest; found down by EMS with unknown down time, suffered PEA arrest with prolonged resuscitation. Currently on hypothermia protocol Patient is currently intubated on ventilator support.  MV: 16.0 L/min Temp (24hrs), Avg:91.6 F (33.1 C), Min:90.5 F (32.5 C), Max:93.2 F (34 C)  Propofol: none  Nutrition Focused Physical Exam: Subcutaneous Fat:  Orbital Region: WNL Upper Arm Region: WNL Thoracic and Lumbar Region: WNL  Muscle:  Temple Region: WNL Clavicle Bone Region: WNL Clavicle and Acromion Bone Region: WNL Scapular Bone Region: WNL Dorsal Hand: WNL Patellar Region: WNL Anterior Thigh Region: WNL Posterior Calf Region: WNL  Edema: none present  Pt without evidence of muscle wasting, however variable wt hx, and social hx unknown (per RN, potentially homeless).  Will advance TFs slowly and recommend monitoring for refeeding syndrome.   Height: Ht Readings from Last 1  Encounters:  03/07/2013 5' 9.5" (1.765 m)    Weight: Wt Readings from Last 1 Encounters:  03/14/2013 226 lb 6.6 oz (102.7 kg)    Ideal Body Weight: 145 lbs  % Ideal Body Weight: 155%  Wt Readings from Last 10 Encounters:  03/26/2013 226 lb 6.6 oz (102.7 kg)  01/11/13 250 lb 3.6 oz (113.5 kg)  12/25/12 235 lb 10.8 oz (106.9 kg)  12/25/12 235 lb 10.8 oz (106.9 kg)    Usual Body Weight: variable, 235-250 lbs Oct-Nov 2014  % Usual Body Weight: difficult to assess  BMI:  Body mass index is 32.97 kg/(m^2).  Estimated Nutritional Needs: Kcal (Permissive underfeeding): 1450-1647 Protein: 131g Fluid: ~2.0 L/day  Skin: intact  Diet Order: NPO  EDUCATION NEEDS: -Education not appropriate at this time   Intake/Output Summary (Last 24 hours) at 03/21/2013 1300 Last data filed at  1200  Gross per 24 hour  Intake 1257.89 ml  Output      0 ml  Net 1257.89 ml    Last BM: PTA  Labs:   Recent Labs Lab 03/17/2013 0801 03/03/2013 1100  NA 137 138  K 3.6* 4.4  CL 99 103  CO2 16* 15*  BUN 15 17  CREATININE 0.95 1.15*  CALCIUM 9.6 8.4  GLUCOSE 214* 118*    CBG (last 3)  No results found for this basename: GLUCAP,  in the last 72 hours  Scheduled Meds: . ampicillin-sulbactam (UNASYN) IV  3 g Intravenous Q6H  . antiseptic oral rinse  15 mL Mouth Rinse QID  . artificial tears  1 application Both Eyes Q8H  . chlorhexidine  15 mL Mouth Rinse BID  . cisatracurium  0.1 mg/kg (Adjusted) Intravenous Once  . fentaNYL  100 mcg Intravenous Once  . fentaNYL  100 mcg Intravenous Once  . insulin aspart  2-6 Units Subcutaneous Q4H  . midazolam  2 mg Intravenous Once  . pantoprazole (PROTONIX) IV  40 mg Intravenous Daily    Continuous Infusions: . sodium chloride 10 mL/hr at 04/01/2013 1300  . amiodarone (NEXTERONE PREMIX) 360 mg/200 mL dextrose 60 mg/hr (03/20/2013 1158)  . amiodarone (NEXTERONE PREMIX) 360 mg/200 mL dextrose    . cisatracurium (NIMBEX) infusion 1 mcg/kg/min  (04/02/2013 1158)  . fentaNYL infusion INTRAVENOUS 50 mcg/hr (04/01/2013 0920)  . midazolam (VERSED) infusion 1 mg/hr (03/09/2013 0921)  . norepinephrine (LEVOPHED) Adult infusion    .  sodium bicarbonate  infusion 1000 mL 100 mL/hr at 03/27/2013 1252    Past Medical History  Diagnosis Date  . Hypertension   . Atrial fibrillation   . Lightheadedness   . Chest pain   . SOB (shortness of breath)   . Diaphoresis   . Nauseated   . Bipolar 1 disorder   . History of paranoid schizophrenia   . Multinodular goiter     BY THYROID ULTRASOUND 07/30/2010  . Hypothyroidism   . Anemia     Past Surgical History  Procedure Laterality Date  . Transthoracic echocardiogram  07/30/2010    Left ventricle: The cavity size was normal. Wall thickness was increased in a pattern of mild LVH. Systolic function was normal.  The estimated ejection fraction was in the range of 60% to 65%.  Rhae Hammock without cardioversion N/A 12/23/2012    Procedure: TRANSESOPHAGEAL ECHOCARDIOGRAM (TEE);  Surgeon: Ricki Rodriguez, MD;  Location: [facility] ENDOSCOPY;  Service: Cardiovascular;  Laterality: N/A;  INPATIENT AT [place] WILL RETURN TO [place] PER DOCTOR  BM   . Cardioversion N/A 12/23/2012    Procedure: CARDIOVERSION;  Surgeon: Ricki Rodriguez, MD;  Location: [facility] ENDOSCOPY;  Service: Cardiovascular;  Laterality: N/A;    Loyce Dys, MS RD LDN Clinical Inpatient Dietitian Pager: [phone] Weekend/After hours pager: [phone]

## 2013-03-08 NOTE — ED Notes (Signed)
Results of i-stat troponin told to Dr. Ranae Palms

## 2013-03-08 NOTE — Consult Note (Addendum)
CARDIOLOGY CONSULT NOTE   Patient ID: Ashley Jones MRN: [id number] DOB/AGE: 11-24-1975 38 y.o.  Admit Date: 03/31/2013  Primary Physician:  Primary Cardiologist   Harwani   Clinical Summary   I was called urgently to come to the emergency room immediately to help assess the patient. There was question of whether she could have cardiac tamponade.  By history the patient has had paroxysmal atrial fibrillation. There is a history of left ventricular dysfunction. She is on an anticoagulant. The hospital records suggest that she was to have post hospital followup with Dr. Sharyn LullHarwani after her last hospitalization. We are contacting him to ask him to obtain his records and take over the patient's general cardiology care.  The patient had an out of hospital cardiac arrest today. She was found down for an unknown period of time. The initial rhythm was ventricular fibrillation. She was defibrillated. It is reported that she then had PEA for a period of time. Her overall resuscitation was at least 30 minutes. The emergency room records note that there was a pericardiocentesis done under ultrasound guidance by the emergency room physician.Marland Kitchen. Approximately 100 cc of serous fluid was obtained. The note states that the patient's blood pressure improved after this.   When our team arrived we requested emergent 2-D echo. I observed this study being done at the bedside. I have now also officially read the EKG and compare to prior study in October, 2014. The patient has severe left ventricular dysfunction. The ejection fraction currently is in the range of 25%. This may be slightly worse than the prior study. There is significant right ventricular dysfunction. This was present on the prior study. There is a very small pericardial effusion. It is seen primarily along the right atrial wall. This is unchanged from the prior tracing. At the time of this echo, this effusion was not hemodynamically  significant.  Decision was made after the complete echo that further pericardiocentesis attempt was not necessary. She is being admitted with the usual protocol for out-of-hospital cardiac arrest. She is to be cooled.   Allergies  Allergen Reactions  . Tetracyclines & Related Hives    Medications Scheduled Medications: . antiseptic oral rinse  15 mL Mouth Rinse QID  . artificial tears  1 application Both Eyes Q8H  . aspirin  300 mg Rectal NOW  . chlorhexidine  15 mL Mouth Rinse BID  . fentaNYL  100 mcg Intravenous Once  . fentaNYL  100 mcg Intravenous Once  . insulin aspart  2-6 Units Subcutaneous Q4H  . midazolam  2 mg Intravenous Once  . midazolam  2 mg Intravenous Once  . pantoprazole (PROTONIX) IV  40 mg Intravenous Daily     Infusions: . sodium chloride    . cisatracurium     And  . cisatracurium (NIMBEX) infusion     And  . cisatracurium    . cisatracurium (NIMBEX) infusion     And  . cisatracurium    . fentaNYL infusion INTRAVENOUS 50 mcg/hr (2013/08/03 0920)  . fentaNYL infusion INTRAVENOUS    . midazolam (VERSED) infusion 1 mg/hr (2013/08/03 0921)  . midazolam (VERSED) infusion    . midazolam    . midazolam    . norepinephrine (LEVOPHED) Adult infusion    . norepinephrine (LEVOPHED) Adult infusion       PRN Medications:  cisatracurium, cisatracurium, fentaNYL, fentaNYL, fentaNYL, fentaNYL, midazolam, midazolam, midazolam, midazolam   Past Medical History  Diagnosis Date  . Hypertension   . Atrial  fibrillation   . Lightheadedness   . Chest pain   . SOB (shortness of breath)   . Diaphoresis   . Nauseated   . Bipolar 1 disorder   . History of paranoid schizophrenia   . Multinodular goiter     BY THYROID ULTRASOUND 07/30/2010  . Hypothyroidism   . Anemia     Past Surgical History  Procedure Laterality Date  . Transthoracic echocardiogram  07/30/2010    Left ventricle: The cavity size was normal. Wall thickness was increased in a pattern of mild  LVH. Systolic function was normal.  The estimated ejection fraction was in the range of 60% to 65%.  Rhae Hammock without cardioversion N/A 12/23/2012    Procedure: TRANSESOPHAGEAL ECHOCARDIOGRAM (TEE);  Surgeon: Ricki Rodriguez, MD;  Location[facility] ENDOSCOPY;  Service: Cardiovascular;  Laterality: N/A;  INPATIENT AT [place] WILL RETURN TO [place] PER DOCTOR  BM   . Cardioversion N/A 12/23/2012    Procedure: CARDIOVERSION;  Surgeon: Ricki Rodriguez, MD;  Location: [facility] ENDOSCOPY;  Service: Cardiovascular;  Laterality: N/A;    Family History  Problem Relation Age of Onset  . Heart attack Mother   . Hypertension Mother     Social History Ms. Wach reports that she quit smoking about 18 years ago. She does not have any smokeless tobacco history on file. Ms. Bult reports that she does not drink alcohol.  Review of Systems   Patient is intubated. Review of systems cannot be obtained.   Physical Examination Blood pressure 124/91, pulse 102, temperature 93.2 F (34 C), temperature source Core (Comment), resp. rate 14, height 5' 9.5" (1.765 m), weight 230 lb (104.327 kg), SpO2 95.00%. The patient's blood pressure at the time of my evaluation was stable. Her exam is limited. She is intubated. Cardiac exam reveals S1 and S2.   Intake/Output Summary (Last 24 hours) at 03/23/2013 0937 Last data filed at 03/09/2013 0854  Gross per 24 hour  Intake    900 ml  Output      0 ml  Net    900 ml      Prior Cardiac Testing/Procedures  Lab Results  Basic Metabolic Panel:  Recent Labs Lab 03/29/2013 0801  NA 137  K 3.6*  CL 99  CO2 16*  GLUCOSE 214*  BUN 15  CREATININE 0.95  CALCIUM 9.6    Liver Function Tests:  Recent Labs Lab 03/09/2013 0801  AST 39*  ALT 33  ALKPHOS 69  BILITOT 0.6  PROT 6.7  ALBUMIN 3.2*    CBC:  Recent Labs Lab 03/14/2013 0801  WBC 4.8  HGB 10.8*  HCT 33.9*  MCV 96.9  PLT 158    Cardiac Enzymes: No results found for this basename: CKTOTAL,  CKMB, CKMBINDEX, TROPONINI,  in the last 168 hours  BNP: No components found with this basename: POCBNP,    Radiology: Dg Chest Portable 1 View  03/24/2013   CLINICAL DATA:  Cardiac arrest.  EXAM: PORTABLE CHEST - 1 VIEW  COMPARISON:  Two-view chest 01/09/2013.  FINDINGS: The heart is enlarged. Diffuse interstitial and airspace edema is present. The patient is intubated. The endotracheal tube terminates 4.2 cm above the chronic, in satisfactory position. A left subclavian line is in place. The tip is at or just above the cavoatrial junction. An NG tube courses off the inferior border of the film. A defibrillator or pacing pad is present over the heart. A small right pleural effusion is suspected. The visualized soft tissues and bony  thorax are unremarkable.  IMPRESSION: 1. Stable cardiomegaly with progressive interstitial and airspace disease likely representing edema. 2. Satisfactory positioning of the support apparatus as described. 3. Probable right pleural effusion.   Electronically Signed   By: Gennette Pac M.D.   On: 03/05/2013 09:26    Up to this point I cannot yet finding EKG. I have spoken with the nurse to obtain one rapidly.   Impression and Recommendations    Atrial fibrillation with rapid ventricular response, paroxysmal     There is a history of paroxysmal atrial fibrillation. The patient has been cardioverted in the past. She was being anticoagulated.    Hypothyroid     Thyroid functions checked in the past few months or in the normal range.    History of DVT (deep vein thrombosis)    Cardiac arrest     The patient presented today with cardiac arrest. It is my understanding that the first rhythm seen was ventricular fibrillation. Fortunately with a extensive resuscitation attempt the patient was returned to sinus rhythm. As outlined above, the emergency room physician performed a pericardiocentesis under ultrasound guidance. 100 cc of fluid was obtained by report. It was  serous. Overall the current echo shows only a small pericardial effusion. This appears unchanged from the prior study of October, 2014. Clinically as reported from the ER, the patient's blood pressure stabilized after the tap was done. It is difficult to know for sure at this point exactly where the fluid came from. There is a right pleural effusion. Regardless, at this time the patient does not have pericardial tamponade on. The current echo looks similar to the prior echo. The patient has severe left ventricular dysfunction. The presenting rhythm was ventricular fibrillation. The patient will be treated for an anoxic brain injury. Hopefully she will improve. If so she will be a candidate for complete cardiac evaluation. I will leave all of this to Dr. Sharyn Lull.    Acute respiratory failure     Patient is currently on the respirator.    Chronic systolic CHF (congestive heart failure)    Ventricular fibrillation      This was the presenting rhythm.   Mitral regurgitation   Patient has moderately severe mitral regurgitation.  Right ventricular dysfunction   The patient had right ventricular dysfunction on her last echo in October, 2014. There is a history of deep venous thrombosis. Consideration will have to be given to whether or not her presentation today was related to pulmonary embolus. However she was anticoagulated and the right ventricular dysfunction is not new.  As part of today's evaluation I went to the emergency room on an emergent basis. I was in the emergency room during the time of the echo for complete evaluation. I made the decision to not proceed with any further pericardiocentesis. I help make the arrangements for primary cardiologist to be notified. I spent greater than an hour with her overall care. I also personally reviewed old and current echoes.   Jerral Bonito, MD  03/29/2013, 9:37 AM

## 2013-03-08 NOTE — ED Notes (Signed)
Epi given

## 2013-03-08 NOTE — Procedures (Signed)
Arterial Catheter Insertion Procedure Note Ashley Jones [id number] [birth year]  Procedure: Insertion of Arterial Catheter  Indications: Blood pressure monitoring  Procedure Details Consent: Unable to obtain consent because of emergent medical necessity. Time Out: Verified patient identification, verified procedure, site/side was marked, verified correct patient position, special equipment/implants available, medications/allergies/relevent history reviewed, required imaging and test results available.  Performed  Maximum sterile technique was used including antiseptics, cap, gloves, gown, hand hygiene, mask and sheet. Skin prep: Chlorhexidine; local anesthetic administered 20 gauge catheter was inserted into left radial artery using the Seldinger technique.  Evaluation Blood flow good; BP tracing good. Complications: No apparent complications.   Morley Kos 03/29/2013

## 2013-03-08 NOTE — ED Notes (Addendum)
Dr. Loretha Stapler and Deatra James at the bedside to perform pericardial centesis. Tampenod noted with [place] machine by Dr. Loretha Stapler

## 2013-03-08 NOTE — Procedures (Signed)
Name:  [name] MRN:  [id number] DOB:  1975/05/04  PROCEDURE NOTE  Procedure:  Central venous catheter placement.  Indications:  Need for intravenous access and hemodynamic monitoring.  Consent:  Consent was implied due to the emergency nature of the procedure.  Anesthesia:  A total of 10 mL of 1% Lidocaine was used for local infiltration anesthesia.  Procedure summary:  Appropriate equipment was assembled.  The patient was identified as Ashley Jones and safety timeout was performed. The patient was placed in Trendelenburg position.  Sterile technique was used. The patient's left anterior chest wall was prepped using chlorhexidine / alcohol scrub and the field was draped in usual sterile fashion with full body drape. After the adequate anesthesia was achieved, the left subclavian vein was cannulated with the introducer needle without difficulty. A guide wire was advanced through the introducer needle, which was then withdrawn. A small skin incision was made at the point of wire entry, the dilator was inserted over the guide wire and appropriate dilation was obtained. The dilator was removed and triple-lumen catheter was advanced over the guide wire, which was then removed.  All ports were aspirated and flushed with normal saline without difficulty. The catheter was secured into place. Antibiotic patch was placed and sterile dressing was applied. Post-procedure chest x-ray was ordered.  Complications:  No immediate complications were noted.  Hemodynamic parameters and oxygenation remained stable throughout the procedure.  Estimated blood loss:  Less then 5 mL.  Lonia Farber, MD Pulmonary and Critical Care Medicine [facility] Cell: [phone]  03/23/2013, 8:54 AM

## 2013-03-09 ENCOUNTER — Inpatient Hospital Stay (HOSPITAL_COMMUNITY): Payer: Medicaid Other

## 2013-03-09 DIAGNOSIS — I4891 Unspecified atrial fibrillation: Secondary | ICD-10-CM

## 2013-03-09 DIAGNOSIS — I509 Heart failure, unspecified: Secondary | ICD-10-CM

## 2013-03-09 DIAGNOSIS — M7989 Other specified soft tissue disorders: Secondary | ICD-10-CM

## 2013-03-09 DIAGNOSIS — I5021 Acute systolic (congestive) heart failure: Secondary | ICD-10-CM

## 2013-03-09 LAB — BASIC METABOLIC PANEL
BUN: 20 mg/dL (ref 6–23)
BUN: 19 mg/dL (ref 6–23)
CALCIUM: 7.9 mg/dL — AB (ref 8.4–10.5)
CHLORIDE: 99 meq/L (ref 96–112)
CO2: 19 mEq/L (ref 19–32)
CO2: 23 meq/L (ref 19–32)
Calcium: 7.5 mg/dL — ABNORMAL LOW (ref 8.4–10.5)
Chloride: 100 mEq/L (ref 96–112)
Creatinine, Ser: 1.35 mg/dL — ABNORMAL HIGH (ref 0.50–1.10)
Creatinine, Ser: 1.56 mg/dL — ABNORMAL HIGH (ref 0.50–1.10)
GFR calc Af Amer: 56 mL/min — ABNORMAL LOW (ref >90)
GFR calc Af Amer: 47 mL/min — ABNORMAL LOW (ref >90)
GFR calc non Af Amer: 41 mL/min — ABNORMAL LOW (ref >90)
GFR, EST NON AFRICAN AMERICAN: 49 mL/min — AB (ref >90)
GLUCOSE: 145 mg/dL — AB (ref 70–99)
Glucose, Bld: 105 mg/dL — ABNORMAL HIGH (ref 70–99)
Potassium: 4.4 mEq/L (ref 3.7–5.3)
Potassium: 4.5 mEq/L (ref 3.7–5.3)
SODIUM: 138 meq/L (ref 137–147)
SODIUM: 139 meq/L (ref 137–147)

## 2013-03-09 LAB — POCT I-STAT, CHEM 8
BUN: 17 mg/dL (ref 6–23)
BUN: 17 mg/dL (ref 6–23)
CALCIUM ION: 1.04 mmol/L — AB (ref 1.12–1.23)
CHLORIDE: 98 meq/L (ref 96–112)
CREATININE: 1.3 mg/dL — AB (ref 0.50–1.10)
Calcium, Ion: 1.02 mmol/L — ABNORMAL LOW (ref 1.12–1.23)
Chloride: 99 mEq/L (ref 96–112)
Creatinine, Ser: 1.2 mg/dL — ABNORMAL HIGH (ref 0.50–1.10)
Glucose, Bld: 143 mg/dL — ABNORMAL HIGH (ref 70–99)
Glucose, Bld: 139 mg/dL — ABNORMAL HIGH (ref 70–99)
HCT: 48 % — ABNORMAL HIGH (ref 36.0–46.0)
HCT: 47 % — ABNORMAL HIGH (ref 36.0–46.0)
Hemoglobin: 16.3 g/dL — ABNORMAL HIGH (ref 12.0–15.0)
Hemoglobin: 16 g/dL — ABNORMAL HIGH (ref 12.0–15.0)
POTASSIUM: 4.3 meq/L (ref 3.7–5.3)
Potassium: 4.4 mEq/L (ref 3.7–5.3)
SODIUM: 139 meq/L (ref 137–147)
SODIUM: 138 meq/L (ref 137–147)
TCO2: 26 mmol/L (ref 0–100)
TCO2: 26 mmol/L (ref 0–100)

## 2013-03-09 LAB — RENAL FUNCTION PANEL
ALBUMIN: 3.4 g/dL — AB (ref 3.5–5.2)
Albumin: 3 g/dL — ABNORMAL LOW (ref 3.5–5.2)
BUN: 19 mg/dL (ref 6–23)
BUN: 19 mg/dL (ref 6–23)
CHLORIDE: 100 meq/L (ref 96–112)
CO2: 22 mEq/L (ref 19–32)
CO2: 22 mEq/L (ref 19–32)
CREATININE: 1.4 mg/dL — AB (ref 0.50–1.10)
Calcium: 7.4 mg/dL — ABNORMAL LOW (ref 8.4–10.5)
Calcium: 7.7 mg/dL — ABNORMAL LOW (ref 8.4–10.5)
Chloride: 100 mEq/L (ref 96–112)
Creatinine, Ser: 1.52 mg/dL — ABNORMAL HIGH (ref 0.50–1.10)
GFR calc Af Amer: 54 mL/min — ABNORMAL LOW (ref >90)
GFR calc Af Amer: 49 mL/min — ABNORMAL LOW (ref >90)
GFR calc non Af Amer: 47 mL/min — ABNORMAL LOW (ref >90)
GFR calc non Af Amer: 42 mL/min — ABNORMAL LOW (ref >90)
GLUCOSE: 124 mg/dL — AB (ref 70–99)
Glucose, Bld: 187 mg/dL — ABNORMAL HIGH (ref 70–99)
PHOSPHORUS: 4.9 mg/dL — AB (ref 2.3–4.6)
PHOSPHORUS: 2.9 mg/dL (ref 2.3–4.6)
POTASSIUM: 4.6 meq/L (ref 3.7–5.3)
Potassium: 5 mEq/L (ref 3.7–5.3)
Sodium: 140 mEq/L (ref 137–147)
Sodium: 140 mEq/L (ref 137–147)

## 2013-03-09 LAB — GLUCOSE, CAPILLARY
GLUCOSE-CAPILLARY: 124 mg/dL — AB (ref 70–99)
GLUCOSE-CAPILLARY: 127 mg/dL — AB (ref 70–99)
GLUCOSE-CAPILLARY: 61 mg/dL — AB (ref 70–99)
GLUCOSE-CAPILLARY: 61 mg/dL — AB (ref 70–99)
GLUCOSE-CAPILLARY: 76 mg/dL (ref 70–99)
GLUCOSE-CAPILLARY: 78 mg/dL (ref 70–99)
GLUCOSE-CAPILLARY: 89 mg/dL (ref 70–99)
Glucose-Capillary: 102 mg/dL — ABNORMAL HIGH (ref 70–99)
Glucose-Capillary: 92 mg/dL (ref 70–99)
Glucose-Capillary: 83 mg/dL (ref 70–99)
Glucose-Capillary: 103 mg/dL — ABNORMAL HIGH (ref 70–99)

## 2013-03-09 LAB — POCT ACTIVATED CLOTTING TIME
ACTIVATED CLOTTING TIME: 188 s
ACTIVATED CLOTTING TIME: 193 s
ACTIVATED CLOTTING TIME: 193 s
ACTIVATED CLOTTING TIME: 199 s
Activated Clotting Time: 193 seconds
Activated Clotting Time: 199 seconds
Activated Clotting Time: 204 seconds
Activated Clotting Time: 210 seconds
Activated Clotting Time: 227 seconds
Activated Clotting Time: 243 seconds
Activated Clotting Time: 199 seconds
Activated Clotting Time: 199 seconds
Activated Clotting Time: 205 seconds

## 2013-03-09 LAB — CBC
HEMATOCRIT: 40.2 % (ref 36.0–46.0)
Hemoglobin: 13 g/dL (ref 12.0–15.0)
MCH: 31 pg (ref 26.0–34.0)
MCHC: 32.3 g/dL (ref 30.0–36.0)
MCV: 95.9 fL (ref 78.0–100.0)
Platelets: 153 10*3/uL (ref 150–400)
RBC: 4.19 MIL/uL (ref 3.87–5.11)
RDW: 15.7 % — AB (ref 11.5–15.5)
WBC: 27 10*3/uL — ABNORMAL HIGH (ref 4.0–10.5)

## 2013-03-09 LAB — TROPONIN I: Troponin I: 0.64 ng/mL (ref <0.30)

## 2013-03-09 LAB — POCT I-STAT 3, ART BLOOD GAS (G3+)
ACID-BASE EXCESS: 5 mmol/L — AB (ref 0.0–2.0)
Acid-Base Excess: 2 mmol/L (ref 0.0–2.0)
Acid-Base Excess: 1 mmol/L (ref 0.0–2.0)
Acid-base deficit: 5 mmol/L — ABNORMAL HIGH (ref 0.0–2.0)
Acid-base deficit: 5 mmol/L — ABNORMAL HIGH (ref 0.0–2.0)
Acid-base deficit: 5 mmol/L — ABNORMAL HIGH (ref 0.0–2.0)
BICARBONATE: 23.7 meq/L (ref 20.0–24.0)
BICARBONATE: 23.7 meq/L (ref 20.0–24.0)
BICARBONATE: 23.1 meq/L (ref 20.0–24.0)
BICARBONATE: 26.9 meq/L — AB (ref 20.0–24.0)
BICARBONATE: 26.7 meq/L — AB (ref 20.0–24.0)
Bicarbonate: 23.4 mEq/L (ref 20.0–24.0)
O2 SAT: 100 %
O2 SAT: 100 %
O2 SAT: 100 %
O2 Saturation: 100 %
O2 Saturation: 100 %
O2 Saturation: 98 %
PCO2 ART: 49.5 mmHg — AB (ref 35.0–45.0)
PCO2 ART: 24.4 mmHg — AB (ref 35.0–45.0)
PCO2 ART: 43.5 mmHg (ref 35.0–45.0)
PO2 ART: 376 mmHg — AB (ref 80.0–100.0)
PO2 ART: 142 mmHg — AB (ref 80.0–100.0)
Patient temperature: 33.1
Patient temperature: 32.9
Patient temperature: 33.9
Patient temperature: 34.5
Patient temperature: 35.1
TCO2: 25 mmol/L (ref 0–100)
TCO2: 25 mmol/L (ref 0–100)
TCO2: 25 mmol/L (ref 0–100)
TCO2: 24 mmol/L (ref 0–100)
TCO2: 28 mmol/L (ref 0–100)
TCO2: 28 mmol/L (ref 0–100)
pCO2 arterial: 46.9 mmHg — ABNORMAL HIGH (ref 35.0–45.0)
pCO2 arterial: 46.7 mmHg — ABNORMAL HIGH (ref 35.0–45.0)
pCO2 arterial: 28.7 mmHg — ABNORMAL LOW (ref 35.0–45.0)
pH, Arterial: 7.267 — ABNORMAL LOW (ref 7.350–7.450)
pH, Arterial: 7.285 — ABNORMAL LOW (ref 7.350–7.450)
pH, Arterial: 7.291 — ABNORMAL LOW (ref 7.350–7.450)
pH, Arterial: 7.573 — ABNORMAL HIGH (ref 7.350–7.450)
pH, Arterial: 7.57 — ABNORMAL HIGH (ref 7.350–7.450)
pH, Arterial: 7.387 (ref 7.350–7.450)
pO2, Arterial: 434 mmHg — ABNORMAL HIGH (ref 80.0–100.0)
pO2, Arterial: 365 mmHg — ABNORMAL HIGH (ref 80.0–100.0)
pO2, Arterial: 171 mmHg — ABNORMAL HIGH (ref 80.0–100.0)
pO2, Arterial: 99 mmHg (ref 80.0–100.0)

## 2013-03-09 LAB — HEPARIN LEVEL (UNFRACTIONATED): Heparin Unfractionated: 0.39 IU/mL (ref 0.30–0.70)

## 2013-03-09 LAB — MAGNESIUM: Magnesium: 1.9 mg/dL (ref 1.5–2.5)

## 2013-03-09 LAB — APTT
APTT: 155 s — AB (ref 24–37)
aPTT: 175 seconds — ABNORMAL HIGH (ref 24–37)
aPTT: 196 seconds — ABNORMAL HIGH (ref 24–37)

## 2013-03-09 LAB — LACTIC ACID, PLASMA: LACTIC ACID, VENOUS: 3.7 mmol/L — AB (ref 0.5–2.2)

## 2013-03-09 LAB — PROCALCITONIN: Procalcitonin: 29.82 ng/mL

## 2013-03-09 MED ORDER — DEXTROSE 50 % IV SOLN
25.0000 mL | Freq: Once | INTRAVENOUS | Status: AC | PRN
  Administered 2013-03-09: 25 mL via INTRAVENOUS
  Filled 2013-03-09: qty 50

## 2013-03-09 MED ORDER — PRISMASOL BGK 4/2.5 32-4-2.5 MEQ/L IV SOLN
INTRAVENOUS | Status: DC
  Administered 2013-03-09 – 2013-03-10 (×2): via INTRAVENOUS_CENTRAL
  Filled 2013-03-09 (×4): qty 5000

## 2013-03-09 MED ORDER — SODIUM CHLORIDE 0.9 % IV SOLN
3.0000 g | Freq: Three times a day (TID) | INTRAVENOUS | Status: DC
  Administered 2013-03-09 – 2013-03-10 (×3): 3 g via INTRAVENOUS
  Filled 2013-03-09 (×6): qty 3

## 2013-03-09 MED ORDER — HEPARIN (PORCINE) IN NACL 100-0.45 UNIT/ML-% IJ SOLN
1000.0000 [IU]/h | INTRAMUSCULAR | Status: DC
  Administered 2013-03-09: 1200 [IU]/h via INTRAVENOUS
  Filled 2013-03-09 (×3): qty 250

## 2013-03-09 MED ORDER — VITAL HIGH PROTEIN PO LIQD
1000.0000 mL | ORAL | Status: DC
  Administered 2013-03-09: 1000 mL
  Filled 2013-03-09: qty 1000

## 2013-03-09 MED ORDER — DEXTROSE 50 % IV SOLN
25.0000 mL | Freq: Once | INTRAVENOUS | Status: AC | PRN
  Administered 2013-03-09: 25 mL via INTRAVENOUS

## 2013-03-09 MED ORDER — DEXTROSE 10 % IV SOLN: INTRAVENOUS | Status: DC

## 2013-03-09 MED ORDER — DEXTROSE 50 % IV SOLN
INTRAVENOUS | Status: AC
  Administered 2013-03-09: 25 mL
  Filled 2013-03-09: qty 50

## 2013-03-09 MED ORDER — DEXTROSE 50 % IV SOLN: 25.0000 mL | Freq: Once | INTRAVENOUS | Status: AC

## 2013-03-09 NOTE — Progress Notes (Signed)
Pt having "muscle jerking" motions, witnessed also by Pam RN, increased fent/versed then nimbex

## 2013-03-09 NOTE — Progress Notes (Signed)
*  PRELIMINARY RESULTS* Vascular Ultrasound Right lower extremity venous duplex has been completed.  Preliminary findings: Technically limited due to edema and body habitus. No evidence of DVT in visualized veins of right leg.  Farrel Demark, RDMS, RVT  03/09/2013, 12:17 PM

## 2013-03-09 NOTE — Progress Notes (Signed)
Clinical Social Work Department BRIEF PSYCHOSOCIAL ASSESSMENT 03/09/2013  Patient:  Ashley Jones, Ashley Jones     Account Number:  [id number]     Admit date:  03/16/2013  Clinical Social Worker:  Varney Biles  Date/Time:  03/09/2013 12:13 PM  Referred by:  Physician  Date Referred:  03/09/2013 Referred for  Other - See comment   Other Referral:   Locate family   Interview type:  Other - See comment Other interview type:   left voicemail on only phone number left in chart for pt's home    PSYCHOSOCIAL DATA Living Status:  ALONE Admitted from facility:   Level of care:   Primary support name:   Primary support relationship to patient:   Degree of support available:   Poor--pt has struggled with mental health issues, per RN via night RN that spoke with pt's significant other. No contact names/numbers in the chart. Pt has young child who lives with significant other, but pt has been homeless in the recent past.    CURRENT CONCERNS Current Concerns  Other - See comment   Other Concerns:   Medical decision maker needed    SOCIAL WORK ASSESSMENT / PLAN CSW spoke with RN, as CSW consulted to assist in finding family for medical decisions. RN states pt is legally separated, but that night RN states a significant other came to the hospital the other night. There is no note from this encounter and name/number is not in chart. CSW left voicemail on the home number listed in pt's chart, explaining that the hospital is attempting to reach family and message recipient should call the nurses' station and request to speak with the nurse for 2H06. Per RN via the night RN, pt has adult children in [place] but she is estranged from them. CSW continues to follow case and will continue to assist with locating family as needed.   Assessment/plan status:  Psychosocial Support/Ongoing Assessment of Needs Other assessment/ plan:   Information/referral to community resources:   N/A     PATIENT'S/FAMILY'S RESPONSE TO PLAN OF CARE: N/A       Maryclare Labrador, MSW, [facility] Clinical Social Worker [phone]

## 2013-03-09 NOTE — Progress Notes (Addendum)
Brief Interval Progress Note  S:  I received a page that repeat ABG this afternoon showed 7.57/24.4/171/23 after adjusting vent setting this morning.  The patient's ventilator is currently set at [facility], 35, 540, 10, 0.6.  Current minute ventilation = 35 x 540 = 18.900.  A/P I've turned down the vent to a RR of 30, TV of 500, minute ventilation = 15.000.  Will check an ABG in 1 hr, and adjust vent accordingly.   Signed, Janalyn Harder, PGY3 Pgr. [phone] 03/09/2013, 3:05 PM   Addendum 1/7, 4:50pm: Repeat ABG relatively unchanged.  Spoke with Dr. Sung Amabile.  Since a minute ventilation of 19 L/min yielded a PCO2 of 24, and we want a PCO2 of 45-50 to normalize the patient's pH, we will adjust to a minute ventilation of approx 9.5 L/min, with a RR of 18, TV of 500.  Recheck ABG in 1 hr.

## 2013-03-09 NOTE — Progress Notes (Signed)
OK to stop CRRT syringe heparin and ACTs after pharmacy dosed heparin gtt started. Per phone con with Dr Briant Cedar

## 2013-03-09 NOTE — Progress Notes (Signed)
ANTICOAGULATION CONSULT NOTE - Initial Consult  Pharmacy Consult for heparin  Indication:History of DVT/AFib  Allergies  Allergen Reactions  . Tetracyclines & Related Hives    Patient Measurements: Height: 5' 9.5" (176.5 cm) Weight: 226 lb 10.1 oz (102.8 kg) IBW/kg (Calculated) : 67.35 Heparin Dosing Weight: 90kg  Vital Signs: Temp: 97.2 F (36.2 C) (01/07 2200) Temp src: Core (Comment) (01/07 2200) BP: 115/70 mmHg (01/07 2200) Pulse Rate: 70 (01/07 1934)  Labs:  Recent Labs  04/01/2013 0801 03/03/2013 1100  03/31/2013 1700  03/09/13 0440 03/09/13 0844 03/09/13 1304 03/09/13 1430 03/09/13 1555 03/09/13 1920 03/09/13 2100  HGB 10.8*  --   --   --   --  13.0 16.3* 16.0*  --   --   --   --   HCT 33.9*  --   --   --   --  40.2 48.0* 47.0*  --   --   --   --   PLT 158  --   --   --   --  153  --   --   --   --   --   --   APTT 34 38*  --  49*  --  155*  --   --  175*  --   --  196*  LABPROT  --  20.5*  --  22.2*  --   --   --   --   --   --   --   --   INR  --  1.82*  --  2.02*  --   --   --   --   --   --   --   --   HEPARINUNFRC  --   --   --   --   --   --   --   --  0.39  --   --   --   CREATININE 0.95 1.15*  < >  --   < > 1.40* 1.20* 1.30*  --  1.52* 1.56*  --   TROPONINI  --   --   --   --   --  0.64*  --   --   --   --   --   --   < > = values in this interval not displayed.  Estimated Creatinine Clearance: 62.4 ml/min (by C-G formula based on Cr of 1.56).   Medical History: Past Medical History  Diagnosis Date  . Hypertension   . Atrial fibrillation   . Lightheadedness   . Chest pain   . SOB (shortness of breath)   . Diaphoresis   . Nauseated   . Bipolar 1 disorder   . History of paranoid schizophrenia   . Multinodular goiter     BY THYROID ULTRASOUND 07/30/2010  . Hypothyroidism   . Anemia     Medications:  Xarelto 20mg  prior to admission  Assessment: 38 yo with AF, DVT and systolic heart failure (EF 45-50%). Admitted 1/6 after being found down on  sidewalk (downtime unknown). Patient was on xarelto prior to admission, unknown when last dose was taken, CT of chest negative for PE. Patient is currently being rewarmed on hypothermia protocol. She is also on CRRT, was receiving heparin through the machine this morning, now is just receiving heparin drip.  aPTT supratherapeutic at 196.  Will continue to follow HL as well.   Goal of Therapy:  Heparin level 0.3-0.7 units/ml aPTT 66-102 seconds Monitor platelets by anticoagulation protocol:  Yes   Plan:  Will continue to dose heparin based on aptt until HL is correctly correlated Decrease heparin rate to 1000 units/hr Check aPTT in 6 hours and daily Daily HL Continue to monitor H&H and platelets   Agapito GamesAlison Tanequa Kretz, PharmD, BCPS Clinical Pharmacist 03/09/2013 10:45 PM

## 2013-03-09 NOTE — Progress Notes (Signed)
All ABGs were drawn off of A-line not femoral artery. Error in charting.

## 2013-03-09 NOTE — Progress Notes (Signed)
Patient ID: Ashley Jones, female   DOB: 05/11/1975, 38 y.o.   MRN: [id number] [facility]  Patient name: Ashley Jones Medical record number: [id number] Date of birth: Oct 05, 1975 Age: 38 y.o. Gender: female  Primary Care Provider: No PCP Per Patient  Subjective: BP good overnight. No overnight events. Pt remains sedated and intubated.  Objective: Temp:  [90.5 F (32.5 C)-93.2 F (34 C)] 91.2 F (32.9 C) (01/07 0600) Pulse Rate:  [55-106] 55 (01/07 0349) Resp:  [0-35] 35 (01/07 0600) BP: (59-142)/(43-103) 121/72 mmHg (01/07 0600) SpO2:  [53 %-100 %] 96 % (01/07 0600) FiO2 (%):  [90 %-100 %] 90 % (01/07 0600) Weight:  [226 lb 6.6 oz (102.7 kg)-230 lb (104.327 kg)] 226 lb 10.1 oz (102.8 kg) (01/07 0438) Physical Exam: General: Intubated and sedated  Cardiovascular: RRR.  Respiratory: Intubated. CTAB.  Abdomen: + BS, Soft. ND. No masses.  Extremities: No erythema, trace edema.  Neuro: Sedated. Intubated.  Laboratory:  Recent Labs Lab 03/17/2013 0801 03/09/13 0440  WBC 4.8 27.0*  HGB 10.8* 13.0  HCT 33.9* 40.2  PLT 158 153    Recent Labs Lab 03/09/2013 0801  03/15/2013 1900 03/09/13 03/09/13 0440  NA 137  < > 140 139 140  K 3.6*  < > 4.6 4.4 5.0  CL 99  < > 101 100 100  CO2 16*  < > 17* 19 22  BUN 15  < > 20 20 19   CREATININE 0.95  < > 1.30* 1.35* 1.40*  CALCIUM 9.6  < > 7.8* 7.9* 7.4*  PROT 6.7  --   --   --   --   BILITOT 0.6  --   --   --   --   ALKPHOS 69  --   --   --   --   ALT 33  --   --   --   --   AST 39*  --   --   --   --   GLUCOSE 214*  < > 133* 145* 187*  < > = values in this interval not displayed.   Assessment:  1.) Acute renal failure sec ischemic ATN +/- contrast: Mild elevation in creatinine today. CVVHD, electrolytes currently normal.  2.) CHF  3). Pulmonary Edema: Improving. 4.) A.fib  5.) Metabolic and resp Acidosis: improving 6.) Hypothermia: Induced  Plan:  1.) Continue CVVHD 2.) renal function BID  3.) Close  monitoring of K, Mg, Ca, Phos with replacement     Natalia Leatherwood, DO 03/09/2013, 6:28 AM PGY-2, [facility] FPTS Intern pager: [phone], text pages welcome I have seen and examined this patient and agree with plan per Dr Claiborne Billings.  On CVVHD.  Acidosis improved and so will DC bicarb gtt. Hemodynamically improved and levo being weaned.  Biggest issue will be what her neuro fx is after the [place] protocol.  Will increase fluid removal as tolerated Kristilyn Coltrane T,MD 03/09/2013 9:12 AM

## 2013-03-09 NOTE — Progress Notes (Signed)
PULMONARY / CRITICAL CARE MEDICINE   Name: Ashley Jones MRN: [id number] DOB: 1976/01/29    ADMISSION DATE:  03/05/2013 CONSULTATION DATE:  03/24/2013  REFERRING MD :  EDP PRIMARY SERVICE: PCCM  CHIEF COMPLAINT:  Cardiac arrest  BRIEF PATIENT DESCRIPTION:  38 yo with AF, DVT and systolic heart failure (EF 45-50%). Admitted 1/6 after being found down on sidewalk (downtime unknown). Initial rhythm VF, shocked several times, and ACLS algorithm carried out. PEA on arrival to ER. ROSC in ER estimated 12 minutes.  SIGNIFICANT EVENTS / STUDIES:  1/6  Found down, VF, unknown down time. ROSC in ER 12 minutes 1/6  Pericardiocentesis >>> 115 ml serous fluid 1/6  TTE >>> No significant pericardial effusion suggesting pericardial effusion and tamponade was not etiology of arrest, RV dysfxn and LV dsyfxn 1/6  CT angio >>>no PE, but significant airspace disease, likely asp PNA 1/6  Hypothermia protocol started: 0920 AM 1/6  RLE US>>>prelim no DVT 1/7 - rewarming, no improvement in neuro status  LINES / TUBES: OETT 1/6 >>> OGT 1/6 >>> L Dierks CVL 1/6 >>>  CULTURES: 1/7  Sputum >>> 1/7  Blood >>> 1/7 Urine strep pneumo>> 1/7 Urine legionella>>  ANTIBIOTICS: Unasyn 1/6 >>>  SUBJECTIVE:  Rewarming started around 11am this morning.  Blood sugars mildly low this morning.  Lactic acid downtrending.  VITAL SIGNS: Temp:  [90.5 F (32.5 C)-91.9 F (33.3 C)] 91.8 F (33.2 C) (01/07 1300) Pulse Rate:  [55-64] 62 (01/07 1310) Resp:  [0-35] 35 (01/07 1310) BP: (89-142)/(45-103) 121/85 mmHg (01/07 1310) SpO2:  [53 %-100 %] 100 % (01/07 1310) FiO2 (%):  [60 %-100 %] 60 % (01/07 1310) Weight:  [226 lb 10.1 oz (102.8 kg)] 226 lb 10.1 oz (102.8 kg) (01/07 0438)  HEMODYNAMICS: CVP:  [15 mmHg-28 mmHg] 15 mmHg VENTILATOR SETTINGS: Vent Mode:  [-] PRVC FiO2 (%):  [60 %-100 %] 60 % Set Rate:  [30 bmp-35 bmp] 35 bmp Vt Set:  [400 mL-540 mL] 540 mL PEEP:  [10 cmH20-24 cmH20] 10 cmH20 Plateau  Pressure:  [26 cmH20-37 cmH20] 27 cmH20 INTAKE / OUTPUT: Intake/Output     01/06 0701 - 01/07 0700 01/07 0701 - 01/08 0700   I.V. (mL/kg) 3390.1 (33) 445.8 (4.3)   IV Piggyback 400    Total Intake(mL/kg) 3790.1 (36.9) 445.8 (4.3)   Urine (mL/kg/hr) 14    Emesis/NG output 50    Other 1824 658 (1)   Total Output 1888 658   Net +1902.1 -212.2         PHYSICAL EXAMINATION: General: lying in bed, intubated HEENT: PERRL, ETT in place Neck: supple Lungs: mechanical breath sounds, scattered ronchi Heart: bradycardic, regular rhythm, distant heart sounds Abdomen: soft, non-tender, non-distended, hypoactive bowel sounds Extremities: 2+ edema bilaterally Neurologic: sedated, no spontaneous movements  LABS:  CBC  Recent Labs Lab 03/22/2013 0801 03/09/13 0440 03/09/13 0844 03/09/13 1304  WBC 4.8 27.0*  --   --   HGB 10.8* 13.0 16.3* 16.0*  HCT 33.9* 40.2 48.0* 47.0*  PLT 158 153  --   --    Coag's  Recent Labs Lab 03/24/2013 1100 03/06/2013 1700 03/09/13 0440  APTT 38* 49* 155*  INR 1.82* 2.02*  --    BMET  Recent Labs Lab 03/25/2013 1900 03/09/13 03/09/13 0440 03/09/13 0844 03/09/13 1304  NA 140 139 140 139 138  K 4.6 4.4 5.0 4.4 4.3  CL 101 100 100 98 99  CO2 17* 19 22  --   --   BUN  20 20 19 17 17   CREATININE 1.30* 1.35* 1.40* 1.20* 1.30*  GLUCOSE 133* 145* 187* 143* 139*   Electrolytes  Recent Labs Lab 02/07/14 1548 02/07/14 1900 03/09/13 03/09/13 0440  CALCIUM 8.0* 7.8* 7.9* 7.4*  MG  --   --   --  1.9  PHOS 8.1*  --   --  4.9*   Sepsis Markers  Recent Labs Lab 02/07/14 0807 02/07/14 0905 02/07/14 0951 03/09/13 0440 03/09/13 1136  LATICACIDVEN 8.01* 6.9*  --   --  3.7*  PROCALCITON  --   --  <0.10 29.82  --    ABG  Recent Labs Lab 03/09/13 0231 03/09/13 0348 03/09/13 0452  PHART 7.267* 7.285* 7.291*  PCO2ART 49.5* 46.9* 46.7*  PO2ART 434.0* 365.0* 376.0*   Liver Enzymes  Recent Labs Lab 02/07/14 0801 02/07/14 1548 03/09/13 0440   AST 39*  --   --   ALT 33  --   --   ALKPHOS 69  --   --   BILITOT 0.6  --   --   ALBUMIN 3.2* 3.0* 3.4*   Cardiac Enzymes  Recent Labs Lab 03/09/13 0440  TROPONINI 0.64*   Glucose  Recent Labs Lab 03/09/13 0357 03/09/13 0556 03/09/13 0833 03/09/13 0857 03/09/13 0954 03/09/13 1153  GLUCAP 92 78 83 89 124* 127*   Imaging Ct Head Wo Contrast  03/24/2013   CLINICAL DATA:  Unresponsive.  EXAM: CT HEAD WITHOUT CONTRAST  TECHNIQUE: Contiguous axial images were obtained from the base of the skull through the vertex without intravenous contrast.  COMPARISON:  08/17/2010.  FINDINGS: No mass. No hydrocephalus. No hemorrhage. Stable dural calcification. Orbits unremarkable. Mild mucosal thickening in the ethmoidal and sphenoid sinuses. Mastoids are clear. No acute bony abnormality.  IMPRESSION: No acute abnormality.   Electronically Signed   By: Maisie Fushomas  Register   On: 2013-03-27 10:36   Ct Angio Chest Pe W/cm &/or Wo Cm  03/28/2013   CLINICAL DATA:  Found unresponsive, fall cardiac arrest. History of atrial fibrillation deep venous thrombosis.  EXAM: CT ANGIOGRAPHY CHEST WITH CONTRAST  TECHNIQUE: Multidetector CT imaging of the chest was performed using the standard protocol during bolus administration of intravenous contrast. Multiplanar CT image reconstructions including MIPs were obtained to evaluate the vascular anatomy.  CONTRAST:  80 mL Omnipaque  COMPARISON:  DG CHEST 1V PORT dated 03/06/2013; CT ANGIO CHEST W/CM &/OR WO/CM dated 12/20/2012  FINDINGS: There are no filling defects within the pulmonary arteries to suggest acute pulmonary embolism. No acute findings of the aorta or great vessels. No pericardial fluid. Left ventricle is enlarged.  There is dense bibasilar consolidation with air bronchograms in following a large portion of the posterior aspect of the right and left lower lobes. There is patchy airspace disease in upper lobes.  No axillary supraclavicular lymphadenopathy.  Endotracheal tube is in good position the trachea. NG tube extends to stomach.  Limited view of the upper abdomen is unremarkable. Limited view of the skeleton is unremarkable.  Review of the MIP images confirms the above findings.  IMPRESSION: 1. No evidence acute pulmonary embolism. 2. Dense bilateral lower lobe consolidation represent a combination of atelectasis and airspace disease. 3. Extensive patchy airspace disease in the upper lobes may represent aspiration pneumonitis or pulmonary hemorrhage. 4. Endotracheal tube  appears in good position. 5. No pericardial fluid. Findings conveyed Cloyd StagerstoWOFFORD, JOHN DAVIDon 03/22/2013  at11:00.   Electronically Signed   By: Genevive BiStewart  Edmunds M.D.   On: 2013-03-27 11:01   Portable Chest  Xray In Am  03/09/2013   CLINICAL DATA:  Intubated patient.  EXAM: PORTABLE CHEST - 1 VIEW  COMPARISON:  CT chest 2013/03/12. Single view of the chest 2013-03-12.  FINDINGS: Support tubes and lines are unchanged. Bilateral airspace disease persists. Aeration appears improved. No pneumothorax or pleural effusion.  IMPRESSION: Persistent but improved bilateral airspace disease.   Electronically Signed   By: Drusilla Kanner M.D.   On: 03/09/2013 08:05   Dg Chest Port 1 View  03-12-13   CLINICAL DATA:  Hemodialysis catheter placement  EXAM: PORTABLE CHEST - 1 VIEW  COMPARISON:  CT ANGIO CHEST W/CM &/OR WO/CM dated 2013/03/12; DG CHEST 1V PORT dated 12-Mar-2013  FINDINGS: Interval placement of a large-bore right central venous line with tip in the mid SVC. No pneumothorax. Left central venous line and NG tube are unchanged. Endotracheal to is 3.7 cm from carina in good position.  There is dense bibasilar airspace disease again demonstrated  IMPRESSION: 1. Interval placement of a large-bore right central venous line with tip in the mid SVC. No pneumothorax. 2. Additional support apparatus is stable. 3. Dense bibasilar airspace disease   Electronically Signed   By: Genevive Bi M.D.   On:  Mar 12, 2013 15:23   Dg Chest Portable 1 View  12-Mar-2013   CLINICAL DATA:  Cardiac arrest.  EXAM: PORTABLE CHEST - 1 VIEW  COMPARISON:  Two-view chest 01/09/2013.  FINDINGS: The heart is enlarged. Diffuse interstitial and airspace edema is present. The patient is intubated. The endotracheal tube terminates 4.2 cm above the chronic, in satisfactory position. A left subclavian line is in place. The tip is at or just above the cavoatrial junction. An NG tube courses off the inferior border of the film. A defibrillator or pacing pad is present over the heart. A small right pleural effusion is suspected. The visualized soft tissues and bony thorax are unremarkable.  IMPRESSION: 1. Stable cardiomegaly with progressive interstitial and airspace disease likely representing edema. 2. Satisfactory positioning of the support apparatus as described. 3. Probable right pleural effusion.   Electronically Signed   By: Gennette Pac M.D.   On: March 12, 2013 09:26    CXR: 1/6 >>> hardware in good position, diffuse airspace disease  ASSESSMENT / PLAN:  PULMONARY A:  Acute respiratory failure post cardiac arrest. Acute pulmonary edema Likely aspiration pneumonia P:   -Goal pH>7.30, SpO2>92 -Continuous mechanical support, high Ve -VAP bundle -Daily SBT -Trend ABG/CXR -d/c ARDS protocol, attempt to wean down PEEP -unasyn for asp PNA  CARDIOVASCULAR A:   VF/PEA arrest.  Acute on chronic systolic congestive heart failure. Shock, likely cardiogenic. H/o VTE. P:  -cardiology following, appreciate recs -Goal MAP>60 -Trend troponin / lactate -Levophed gtt -amiodarone d/c'ed  RENAL A:   Anuria. Likely AKI. Severe metabolic acidosis. P:   -Renal consulted, appreciate recs -Trend BMP -continue CVVHD -Bicarbonate gtt d/c'ed 1/7  GASTROINTESTINAL A:   Nutrition. GI Px. P:   -Protonix -RD consult, start tube feeds this evening  HEMATOLOGIC A:   On chronic anticoagulation ( Xarelto ) for  DVT. Anemia. P:  -Trend CBC -Hold Xarelto -Heparin per Pharmacy for DVT prophy  INFECTIOUS A:   Suspected aspiration. P:   -Empiric Unasyn   ENDOCRINE A:   Hypoglycemia P:   -starting tube feeds this PM -SSI  NEUROLOGIC A:   Acute encephalopathy. Likely anoxia. P:   -Hypothermia protocol, starting rewarming today -Fentanyl / Versed / Nimbex   Janalyn Harder, PGY3 Pgr. [phone] 03/09/2013, 1:58 PM  Attending:  I have seen and examined the patient with nurse practitioner/resident and agree with the note above.   I am concerned about severe anoxic brain injury Oxygenation improved Maintain current strategy rewarm today  I have personally obtained a history, examined the patient, evaluated laboratory and imaging results, formulated the assessment and plan and placed orders.  CRITICAL CARE: The patient is critically ill with multiple organ systems failure and requires high complexity decision making for assessment and support, frequent evaluation and titration of therapies, application of advanced monitoring technologies and extensive interpretation of multiple databases. Critical Care Time devoted to patient care services described in this note is 35 minutes.   Yolonda Kida PCCM Pager: [phone] Cell: [phone] If no response, call [phone]

## 2013-03-09 NOTE — Progress Notes (Signed)
Awaiting family member , ref flu and pna vacs

## 2013-03-09 NOTE — Progress Notes (Signed)
Subjective:  Patient remains intubated sedated and on paralytics. No further episodes of V. Fib or A. Fib with RVR. QTC has been markedly prolonged amiodarone has been DC'd.  Objective:  Vital Signs in the last 24 hours: Temp:  [90.5 F (32.5 C)-91.9 F (33.3 C)] 91.4 F (33 C) (01/07 1200) Pulse Rate:  [55-64] 57 (01/07 0748) Resp:  [0-35] 35 (01/07 1200) BP: (89-142)/(45-103) 125/87 mmHg (01/07 1200) SpO2:  [53 %-100 %] 100 % (01/07 1200) FiO2 (%):  [80 %-100 %] 80 % (01/07 0748) Weight:  [102.8 kg (226 lb 10.1 oz)] 102.8 kg (226 lb 10.1 oz) (01/07 0438)  Intake/Output from previous day: 01/06 0701 - 01/07 0700 In: 3790.1 [I.V.:3390.1; IV Piggyback:400] Out: 1888 [Urine:14; Emesis/NG output:50] Intake/Output from this shift: Total I/O In: 322.6 [I.V.:322.6] Out: 595 [Other:595]  Physical Exam: Neck: no adenopathy, no carotid bruit and supple, symmetrical, trachea midline Lungs: bilateral course rhonchi and rales Heart: regular rate and rhythm, S1, S2 normal and soft systolic murmur and S3 gallop noted Abdomen: soft, non-tender; bowel sounds normal; no masses,  no organomegaly Extremities: no clubbing cyanosis 3+ edema right leg 1+ edema left leg  Lab Results:  Recent Labs  03/06/2013 0801 03/09/13 0440 03/09/13 0844  WBC 4.8 27.0*  --   HGB 10.8* 13.0 16.3*  PLT 158 153  --     Recent Labs  03/09/13 03/09/13 0440 03/09/13 0844  NA 139 140 139  K 4.4 5.0 4.4  CL 100 100 98  CO2 19 22  --   GLUCOSE 145* 187* 143*  BUN 20 19 17   CREATININE 1.35* 1.40* 1.20*    Recent Labs  03/09/13 0440  TROPONINI 0.64*   Hepatic Function Panel  Recent Labs  03/03/2013 0801  03/09/13 0440  PROT 6.7  --   --   ALBUMIN 3.2*  < > 3.4*  AST 39*  --   --   ALT 33  --   --   ALKPHOS 69  --   --   BILITOT 0.6  --   --   < > = values in this interval not displayed. No results found for this basename: CHOL,  in the last 72 hours No results found for this basename:  PROTIME,  in the last 72 hours  Imaging: Imaging results have been reviewed and Ct Head Wo Contrast  03/17/2013   CLINICAL DATA:  Unresponsive.  EXAM: CT HEAD WITHOUT CONTRAST  TECHNIQUE: Contiguous axial images were obtained from the base of the skull through the vertex without intravenous contrast.  COMPARISON:  08/17/2010.  FINDINGS: No mass. No hydrocephalus. No hemorrhage. Stable dural calcification. Orbits unremarkable. Mild mucosal thickening in the ethmoidal and sphenoid sinuses. Mastoids are clear. No acute bony abnormality.  IMPRESSION: No acute abnormality.   Electronically Signed   By: Maisie Fus  Register   On: 03/09/2013 10:36   Ct Angio Chest Pe W/cm &/or Wo Cm  03/21/2013   CLINICAL DATA:  Found unresponsive, fall cardiac arrest. History of atrial fibrillation deep venous thrombosis.  EXAM: CT ANGIOGRAPHY CHEST WITH CONTRAST  TECHNIQUE: Multidetector CT imaging of the chest was performed using the standard protocol during bolus administration of intravenous contrast. Multiplanar CT image reconstructions including MIPs were obtained to evaluate the vascular anatomy.  CONTRAST:  80 mL Omnipaque  COMPARISON:  DG CHEST 1V PORT dated 03/25/2013; CT ANGIO CHEST W/CM &/OR WO/CM dated 12/20/2012  FINDINGS: There are no filling defects within the pulmonary arteries to suggest acute pulmonary embolism. No  acute findings of the aorta or great vessels. No pericardial fluid. Left ventricle is enlarged.  There is dense bibasilar consolidation with air bronchograms in following a large portion of the posterior aspect of the right and left lower lobes. There is patchy airspace disease in upper lobes.  No axillary supraclavicular lymphadenopathy. Endotracheal tube is in good position the trachea. NG tube extends to stomach.  Limited view of the upper abdomen is unremarkable. Limited view of the skeleton is unremarkable.  Review of the MIP images confirms the above findings.  IMPRESSION: 1. No evidence acute pulmonary  embolism. 2. Dense bilateral lower lobe consolidation represent a combination of atelectasis and airspace disease. 3. Extensive patchy airspace disease in the upper lobes may represent aspiration pneumonitis or pulmonary hemorrhage. 4. Endotracheal tube  appears in good position. 5. No pericardial fluid. Findings conveyed Cloyd StagerstoWOFFORD, JOHN DAVIDon 03/07/2013  at11:00.   Electronically Signed   By: Genevive BiStewart  Edmunds M.D.   On: 2013-09-08 11:01   Portable Chest Xray In Am  03/09/2013   CLINICAL DATA:  Intubated patient.  EXAM: PORTABLE CHEST - 1 VIEW  COMPARISON:  CT chest 2013-09-08. Single view of the chest 2013-09-08.  FINDINGS: Support tubes and lines are unchanged. Bilateral airspace disease persists. Aeration appears improved. No pneumothorax or pleural effusion.  IMPRESSION: Persistent but improved bilateral airspace disease.   Electronically Signed   By: Drusilla Kannerhomas  Dalessio M.D.   On: 03/09/2013 08:05   Dg Chest Port 1 View  03/07/2013   CLINICAL DATA:  Hemodialysis catheter placement  EXAM: PORTABLE CHEST - 1 VIEW  COMPARISON:  CT ANGIO CHEST W/CM &/OR WO/CM dated 03/20/2013; DG CHEST 1V PORT dated 03/05/2013  FINDINGS: Interval placement of a large-bore right central venous line with tip in the mid SVC. No pneumothorax. Left central venous line and NG tube are unchanged. Endotracheal to is 3.7 cm from carina in good position.  There is dense bibasilar airspace disease again demonstrated  IMPRESSION: 1. Interval placement of a large-bore right central venous line with tip in the mid SVC. No pneumothorax. 2. Additional support apparatus is stable. 3. Dense bibasilar airspace disease   Electronically Signed   By: Genevive BiStewart  Edmunds M.D.   On: 2013-09-08 15:23   Dg Chest Portable 1 View  03/30/2013   CLINICAL DATA:  Cardiac arrest.  EXAM: PORTABLE CHEST - 1 VIEW  COMPARISON:  Two-view chest 01/09/2013.  FINDINGS: The heart is enlarged. Diffuse interstitial and airspace edema is present. The patient is intubated. The  endotracheal tube terminates 4.2 cm above the chronic, in satisfactory position. A left subclavian line is in place. The tip is at or just above the cavoatrial junction. An NG tube courses off the inferior border of the film. A defibrillator or pacing pad is present over the heart. A small right pleural effusion is suspected. The visualized soft tissues and bony thorax are unremarkable.  IMPRESSION: 1. Stable cardiomegaly with progressive interstitial and airspace disease likely representing edema. 2. Satisfactory positioning of the support apparatus as described. 3. Probable right pleural effusion.   Electronically Signed   By: Gennette Pachris  Mattern M.D.   On: 2013-09-08 09:26    Cardiac Studies:  Assessment/Plan:  Status post V. fib/PEA cardiac arrest  Status post emergent pericardiocentesis in ED  History of recurrent A. fib with RVR  Decompensated acute systolic heart failure  Nonischemic dilated cardiomyopathy  Valvular heart disease with significant mitral regurgitation  Acute respiratory failure secondary to 1  Metabolic acidosis  Probablebilateral aspiration pneumonia  Multinodular goiter  Hypothyroidism  History of DVT in the past  Tobacco abuse  Obstructive sleep apnea/obesity hypoventilation syndrome  History of paranoid schizophrenia Plan DC amiodarone Check EKG daily Continue present management per CCM  LOS: 1 day    Olivea Sonnen N 03/09/2013, 12:26 PM

## 2013-03-09 NOTE — Progress Notes (Addendum)
ANTICOAGULATION CONSULT NOTE - Initial Consult  Pharmacy Consult for heparin  Indication:History of DVT/AFib  Allergies  Allergen Reactions  . Tetracyclines & Related Hives    Patient Measurements: Height: 5' 9.5" (176.5 cm) Weight: 226 lb 10.1 oz (102.8 kg) IBW/kg (Calculated) : 67.35 Heparin Dosing Weight: 90kg  Vital Signs: Temp: 92.7 F (33.7 C) (01/07 1400) Temp src: Core (Comment) (01/07 1400) BP: 131/96 mmHg (01/07 1400) Pulse Rate: 62 (01/07 1310)  Labs:  Recent Labs  03-21-2013 0801 03/21/13 1100  03/21/2013 1700  03/09/13 0440 03/09/13 0844 03/09/13 1304  HGB 10.8*  --   --   --   --  13.0 16.3* 16.0*  HCT 33.9*  --   --   --   --  40.2 48.0* 47.0*  PLT 158  --   --   --   --  153  --   --   APTT 34 38*  --  49*  --  155*  --   --   LABPROT  --  20.5*  --  22.2*  --   --   --   --   INR  --  1.82*  --  2.02*  --   --   --   --   CREATININE 0.95 1.15*  < >  --   < > 1.40* 1.20* 1.30*  TROPONINI  --   --   --   --   --  0.64*  --   --   < > = values in this interval not displayed.  Estimated Creatinine Clearance: 74.8 ml/min (by C-G formula based on Cr of 1.3).   Medical History: Past Medical History  Diagnosis Date  . Hypertension   . Atrial fibrillation   . Lightheadedness   . Chest pain   . SOB (shortness of breath)   . Diaphoresis   . Nauseated   . Bipolar 1 disorder   . History of paranoid schizophrenia   . Multinodular goiter     BY THYROID ULTRASOUND 07/30/2010  . Hypothyroidism   . Anemia     Medications:  Xarelto 20mg  prior to admission  Assessment: 38 yo with AF, DVT and systolic heart failure (EF 45-50%). Admitted 1/6 after being found down on sidewalk (downtime unknown). Patient was on xarelto prior to admission, unknown when last dose was taken, CT of chest negative for PE. Patient is currently being rewarmed on hypothermia protocol. She is also on CRRT and receiving a significant amount of heparin through the machine (700 units/hr)  and aptt was very elevated at 155 this morning. I have ordered a recheck of aptt and heparin level to assess baseline anticoagulation at this time. Patient was therapeutic on ~1200 units/hr, will now transition off heparin syringe and onto heparin infusion.  Given previous xarelto use possibly within the past 48 hours will dose anticoagulation primarily with aptt testing and will check heparin level daily to assess.  Goal of Therapy:  Heparin level 0.3-0.7 units/ml aPTT 66-102 seconds Monitor platelets by anticoagulation protocol: Yes   Plan:  Baseline aptt/HL -will dose heparin based on HL alone when correctly correlated Start heparin infusion at 1200 units/hr Check anti-Xa level in 6 hours and daily while on heparin Continue to monitor H&H and platelets  Sheppard Coil PharmD., BCPS Clinical Pharmacist Pager [phone] 03/09/2013 2:20 PM

## 2013-03-10 ENCOUNTER — Encounter (HOSPITAL_COMMUNITY): Payer: Self-pay | Admitting: Radiology

## 2013-03-10 ENCOUNTER — Inpatient Hospital Stay (HOSPITAL_COMMUNITY): Payer: Medicaid Other

## 2013-03-10 ENCOUNTER — Other Ambulatory Visit (HOSPITAL_COMMUNITY): Payer: Self-pay

## 2013-03-10 DIAGNOSIS — Z515 Encounter for palliative care: Secondary | ICD-10-CM

## 2013-03-10 LAB — GLUCOSE, CAPILLARY
GLUCOSE-CAPILLARY: 100 mg/dL — AB (ref 70–99)
GLUCOSE-CAPILLARY: 35 mg/dL — AB (ref 70–99)
GLUCOSE-CAPILLARY: 44 mg/dL — AB (ref 70–99)
GLUCOSE-CAPILLARY: 79 mg/dL (ref 70–99)
GLUCOSE-CAPILLARY: 81 mg/dL (ref 70–99)
Glucose-Capillary: 63 mg/dL — ABNORMAL LOW (ref 70–99)
Glucose-Capillary: 101 mg/dL — ABNORMAL HIGH (ref 70–99)
Glucose-Capillary: 30 mg/dL — CL (ref 70–99)
Glucose-Capillary: 32 mg/dL — CL (ref 70–99)
Glucose-Capillary: 48 mg/dL — ABNORMAL LOW (ref 70–99)

## 2013-03-10 LAB — BASIC METABOLIC PANEL
BUN: 19 mg/dL (ref 6–23)
CHLORIDE: 98 meq/L (ref 96–112)
CO2: 25 meq/L (ref 19–32)
Calcium: 7.6 mg/dL — ABNORMAL LOW (ref 8.4–10.5)
Creatinine, Ser: 1.7 mg/dL — ABNORMAL HIGH (ref 0.50–1.10)
GFR calc Af Amer: 43 mL/min — ABNORMAL LOW (ref >90)
GFR calc non Af Amer: 37 mL/min — ABNORMAL LOW (ref >90)
GLUCOSE: 97 mg/dL (ref 70–99)
POTASSIUM: 4.6 meq/L (ref 3.7–5.3)
Sodium: 138 mEq/L (ref 137–147)

## 2013-03-10 LAB — RENAL FUNCTION PANEL
ALBUMIN: 2.9 g/dL — AB (ref 3.5–5.2)
BUN: 20 mg/dL (ref 6–23)
CO2: 23 mEq/L (ref 19–32)
Calcium: 7.6 mg/dL — ABNORMAL LOW (ref 8.4–10.5)
Chloride: 99 mEq/L (ref 96–112)
Creatinine, Ser: 1.85 mg/dL — ABNORMAL HIGH (ref 0.50–1.10)
GFR calc Af Amer: 39 mL/min — ABNORMAL LOW (ref >90)
GFR calc non Af Amer: 33 mL/min — ABNORMAL LOW (ref >90)
Glucose, Bld: 98 mg/dL (ref 70–99)
PHOSPHORUS: 2.6 mg/dL (ref 2.3–4.6)
Potassium: 4.3 mEq/L (ref 3.7–5.3)
Sodium: 138 mEq/L (ref 137–147)

## 2013-03-10 LAB — POCT I-STAT 3, ART BLOOD GAS (G3+)
ACID-BASE DEFICIT: 1 mmol/L (ref 0.0–2.0)
Bicarbonate: 25.3 mEq/L — ABNORMAL HIGH (ref 20.0–24.0)
O2 Saturation: 98 %
TCO2: 27 mmol/L (ref 0–100)
pCO2 arterial: 45.2 mmHg — ABNORMAL HIGH (ref 35.0–45.0)
pH, Arterial: 7.356 (ref 7.350–7.450)
pO2, Arterial: 106 mmHg — ABNORMAL HIGH (ref 80.0–100.0)

## 2013-03-10 LAB — HEPARIN LEVEL (UNFRACTIONATED): Heparin Unfractionated: 0.42 [IU]/mL (ref 0.30–0.70)

## 2013-03-10 LAB — LEGIONELLA ANTIGEN, URINE: LEGIONELLA ANTIGEN, URINE: NEGATIVE

## 2013-03-10 LAB — APTT: aPTT: 148 seconds — ABNORMAL HIGH (ref 24–37)

## 2013-03-10 LAB — MAGNESIUM: Magnesium: 1.9 mg/dL (ref 1.5–2.5)

## 2013-03-10 LAB — PROCALCITONIN: Procalcitonin: 29.87 ng/mL

## 2013-03-10 LAB — STREP PNEUMONIAE URINARY ANTIGEN: STREP PNEUMO URINARY ANTIGEN: NEGATIVE

## 2013-03-10 MED ORDER — DEXTROSE 10 % IV SOLN: INTRAVENOUS | Status: DC

## 2013-03-10 MED ORDER — MORPHINE BOLUS VIA INFUSION
5.0000 mg | INTRAVENOUS | Status: DC | PRN
  Filled 2013-03-10: qty 20

## 2013-03-10 MED ORDER — MORPHINE SULFATE 10 MG/ML IJ SOLN
10.0000 mg/h | INTRAVENOUS | Status: DC
  Administered 2013-03-10: 10 mg/h via INTRAVENOUS
  Filled 2013-03-10: qty 10

## 2013-03-10 MED ORDER — LORAZEPAM 2 MG/ML IJ SOLN: 2.0000 mg | Freq: Once | INTRAMUSCULAR | Status: AC

## 2013-03-10 MED ORDER — DEXTROSE 50 % IV SOLN
50.0000 mL | Freq: Once | INTRAVENOUS | Status: AC | PRN
  Administered 2013-03-10: 50 mL via INTRAVENOUS

## 2013-03-10 MED ORDER — PROPOFOL 10 MG/ML IV EMUL
5.0000 ug/kg/min | INTRAVENOUS | Status: DC
  Administered 2013-03-10: 10 ug/kg/min via INTRAVENOUS
  Filled 2013-03-10: qty 100

## 2013-03-10 MED ORDER — HEPARIN (PORCINE) IN NACL 100-0.45 UNIT/ML-% IJ SOLN
850.0000 [IU]/h | INTRAMUSCULAR | Status: DC
  Administered 2013-03-10: 850 [IU]/h via INTRAVENOUS

## 2013-03-10 MED ORDER — LORAZEPAM 2 MG/ML IJ SOLN
INTRAMUSCULAR | Status: AC
  Administered 2013-03-10: 2 mg
  Filled 2013-03-10: qty 1

## 2013-03-10 MED ORDER — DEXTROSE 50 % IV SOLN
INTRAVENOUS | Status: AC
  Administered 2013-03-10: 50 mL
  Filled 2013-03-10: qty 50

## 2013-03-10 MED ORDER — DEXTROSE 10 % IV SOLN
INTRAVENOUS | Status: DC
  Administered 2013-03-10: 09:00:00 via INTRAVENOUS

## 2013-03-10 MED ORDER — SODIUM CHLORIDE 0.9 % IV SOLN
500.0000 mg | Freq: Two times a day (BID) | INTRAVENOUS | Status: DC
  Administered 2013-03-10 (×2): 500 mg via INTRAVENOUS
  Filled 2013-03-10 (×3): qty 5

## 2013-03-11 ENCOUNTER — Other Ambulatory Visit (HOSPITAL_COMMUNITY): Payer: Self-pay

## 2013-03-12 LAB — CULTURE, RESPIRATORY

## 2013-03-12 LAB — CULTURE, RESPIRATORY W GRAM STAIN

## 2013-03-14 LAB — GLUCOSE, CAPILLARY: GLUCOSE-CAPILLARY: 17 mg/dL — AB (ref 70–99)

## 2013-03-15 NOTE — Discharge Summary (Signed)
NAMEOMIA, BERTZ NO.:  [id number]  MEDICAL RECORD NO.:  [id number]  LOCATION:  2H06C                        FACILITY:  [facility]  PHYSICIAN:  Veto Kemps, MDDATE OF BIRTH:  [birth year]  DATE OF ADMISSION:   DATE OF DISCHARGE:  2013-04-04                              DISCHARGE SUMMARY   DEATH SUMMARY  FINAL DIAGNOSES: 1. Ventricular fibrillation. 2. Congestive heart failure.  HISTORY OF PRESENT ILLNESS:  This was a 38 year old female with a history of atrial fibrillation, DVT, and systolic heart failure, who was admitted on March 08, 2012, after being found down unresponsive on a sidewalk with unknown total down time.  Her initial rhythm was noted to be ventricular fibrillation.  She was shocked by EMS several times and ACLS algorithm was carried out.  Eventually, she developed PEA arrest when she made it to the emergency department.  In the emergency department after resuscitation already given by EMS, she received an additional 12 minutes of ACLS guided resuscitation.  She was admitted to the CCU for further management.  PAST MEDICAL HISTORY:  Please see the admission H and P.  SOCIAL HISTORY:  Please see the admission H and P.  FAMILY HISTORY:  Please see the admission H and P.  PHYSICAL EXAMINATION:  VITAL SIGNS:  On the day of death, temperature 94.8, heart rate 66, respirations 22 on the ventilator, blood pressure 77/43, oxygen saturation 86% on the ventilator. GENERAL:  Lying in bed, intubated. HEENT:  Pupils equal, round, reactive to light, but sluggish, ET tube is in place. NECK:  Supple. LUNGS:  Mechanical breath sounds with scattered rhonchi. HEART:  Bradycardic, regular rhythm, distant heart sounds. ABDOMEN:  Soft, nontender, nondistended.  Hypoactive bowel sounds. EXTREMITIES:  2+ edema bilaterally. NEUROLOGIC:  GCS score is 4.  No spontaneous movements noted.  Flexor response to pain on the right only.  HOSPITAL  COURSE:  Cardiac arrest.  The patient was admitted to the cardiac care unit for post cardiac arrest  management.  The hypothermia protocol was initiated.  The patient was noted to be profoundly hypoxemic and required extremely high support from the ventilator. Dialysis was started on day 1 for acute kidney injury, in addition to other aggressive support which included mechanical ventilation and paralytics for support with mechanical ventilation as well as part of the hypothermia protocol.  After the patient was rewarmed, she was noted to be making no spontaneous movements and had no indication of higher cortical functioning.  We discussed the situation with the patient's family who stated clearly that they did not wish her to continue in this situation because they knew "she is not in there, it is just her body."  Based on this conversation on 2013/04/04, the patient was removed from the ventilator and died peacefully with the patient's family at the bedside.          ______________________________ Veto Kemps, MD     DBM/MEDQ  D:  03/15/2013  T:  03/15/2013  Job:  [id number]

## 2013-03-16 LAB — CULTURE, BLOOD (SINGLE): Culture: NO GROWTH

## 2013-04-03 NOTE — Progress Notes (Signed)
Absent breath sounds, heart tones, and pupillary response. Asystole on monitor. Verified by two RN's. Pt is deceased.  Expired at 1618. No heart tones or spontaneous resp effort. Lack of heart tones assessed x 1 minute.

## 2013-04-03 NOTE — Progress Notes (Signed)
When chaplain visited pt was not awake.  Chaplain rendered support to three family members through his caring and compassionate presence.     03/16/2013 1600  Clinical Encounter Type  Visited With Patient and family together  Visit Type Initial;Spiritual support;Critical Care  Spiritual Encounters  Spiritual Needs Emotional    Rulon Abide, chaplain, pager [phone]

## 2013-04-03 NOTE — Progress Notes (Signed)
[facility] PCCM  Family updated in nursing conference room by me.  Mother, husband, sister, and uncle present.  They understand the severity of her illness and requested that we withdraw support from the ventilator.  In their words, "Her body is here but she is gone.".    Based on this, will enter withdrawal of care order set, discontinue mechanical ventilation and CVVHD, and start a morphine drip  Yolonda Kida PCCM Pager: [phone] Cell: [phone] If no response, call [phone]

## 2013-04-03 NOTE — Progress Notes (Signed)
EEG postponed on reccomendations from Dr Roseanne Reno, on call neurologist, due to induced Hypothermia and currently on dialysis.

## 2013-04-03 NOTE — Progress Notes (Signed)
ANTICOAGULATION CONSULT NOTE - Initial Consult  Pharmacy Consult for heparin  Indication:History of DVT/AFib  Patient Measurements: Height: 5' 9.5" (176.5 cm) Weight: 226 lb 10.1 oz (102.8 kg) IBW/kg (Calculated) : 67.35 Heparin Dosing Weight: 90kg  Vital Signs: Temp: 93 F (33.9 C) (01/08 0900) Temp src: Core (Comment) (01/08 0800) BP: 93/63 mmHg (01/08 0900) Pulse Rate: 90 (01/08 0441)  Labs:  Recent Labs  03/20/2013 0801 03/07/2013 1100  03/30/2013 1700  03/09/13 0440 03/09/13 0844 03/09/13 1304 03/09/13 1430  03/09/13 1920 03/09/13 2100 03/09/13 2300 25-Mar-2013 0400 03-25-13 0430  HGB 10.8*  --   --   --   --  13.0 16.3* 16.0*  --   --   --   --   --   --   --   HCT 33.9*  --   --   --   --  40.2 48.0* 47.0*  --   --   --   --   --   --   --   PLT 158  --   --   --   --  153  --   --   --   --   --   --   --   --   --   APTT 34 38*  --  49*  --  155*  --   --  175*  --   --  196*  --  148*  --   LABPROT  --  20.5*  --  22.2*  --   --   --   --   --   --   --   --   --   --   --   INR  --  1.82*  --  2.02*  --   --   --   --   --   --   --   --   --   --   --   HEPARINUNFRC  --   --   --   --   --   --   --   --  0.39  --   --   --   --  0.42  --   CREATININE 0.95 1.15*  < >  --   < > 1.40* 1.20* 1.30*  --   < > 1.56*  --  1.70*  --  1.85*  TROPONINI  --   --   --   --   --  0.64*  --   --   --   --   --   --   --   --   --   < > = values in this interval not displayed.  Estimated Creatinine Clearance: 52.6 ml/min (by C-G formula based on Cr of 1.85).  Medications:  Xarelto 20mg  prior to admission  Assessment: 38 yo with AF, DVT and systolic heart failure (EF 45-50%). Admitted 1/6 after being found down on sidewalk (downtime unknown). Patient was on xarelto prior to admission, unknown when last dose was taken, CT of chest negative for PE.   Patient is currently rewarmed on hypothermia protocol. She is also on CRRT. Heparin levels appear to be correlating on IV heparin  but aptt still elevated, still unclear is coagulapathy is playing a role here. Will adjust heparin rate and recheck labs.  Goal of Therapy:  Heparin level 0.3-0.7 units/ml aPTT 66-102 seconds Monitor platelets by anticoagulation protocol: Yes   Plan:  Will continue to dose  heparin based on aptt until HL is correctly correlated Decrease heparin rate to 850 units/hr Check aPTT in 6 hours and daily Daily HL Continue to monitor H&H and platelets  Sheppard CoilFrank Wilson PharmD., BCPS Clinical Pharmacist Pager [phone] 03/08/2013 9:38 AM

## 2013-04-03 NOTE — Progress Notes (Signed)
Nutrition Brief Note  Chart reviewed. Pt now transitioning to comfort care.  No further nutrition interventions warranted at this time.  Please re-consult as needed.   Almer Littleton, MS RD LDN Clinical Inpatient Dietitian Pager: [phone] Weekend/After hours pager: [phone]    

## 2013-04-03 NOTE — Progress Notes (Signed)
Patient ID: Ashley Jones, female   DOB: [birth year], 38 y.o.   MRN: [id number] [name]  Patient name: Ashley Jones Medical record number: [id number] Date of birth: 08-Dec-1975 Age: 38 y.o. Gender: female  Primary Care Provider: No PCP Per Patient  Subjective: Overnight patient ws noted to have seizure-like activity. Low body temperature and low glucose.   Objective: Temp:  [91.2 F (32.9 C)-98.6 F (37 C)] 98.1 F (36.7 C) (01/08 0400) Pulse Rate:  [57-90] 90 (01/08 0441) Resp:  [0-35] 18 (01/08 0700) BP: (89-131)/(52-103) 101/65 mmHg (01/08 0700) SpO2:  [97 %-100 %] 100 % (01/08 0700) FiO2 (%):  [40 %-80 %] 40 % (01/08 0600) Weight:  [226 lb 10.1 oz (102.8 kg)] 226 lb 10.1 oz (102.8 kg) (01/08 0436) Weight change: -3 lb 5.9 oz (-1.527 kg)   Intake/Output Summary (Last 24 hours) at 03/27/2013 0821 Last data filed at 03/24/2013 0800  Gross per 24 hour  Intake 2190.63 ml  Output   3217 ml  Net -1026.37 ml    Physical Exam: General: Intubated and sedated  Cardiovascular: RRR.  Respiratory: Intubated. Coarse rhonchi bilaterally Abdomen: + BS, Soft. ND. No masses.  Extremities: No erythema, +1 edema.  Neuro: Sedated. Intubated.  Laboratory:  Recent Labs Lab 03/20/2013 0801 03/09/13 0440 03/09/13 0844 03/09/13 1304  WBC 4.8 27.0*  --   --   HGB 10.8* 13.0 16.3* 16.0*  HCT 33.9* 40.2 48.0* 47.0*  PLT 158 153  --   --     Recent Labs Lab 03/16/2013 0801  03/09/13 1920 03/09/13 2300 03/03/2013 0430  NA 137  < > 138 138 138  K 3.6*  < > 4.5 4.6 4.3  CL 99  < > 99 98 99  CO2 16*  < > 23 25 23   BUN 15  < > 19 19 20   CREATININE 0.95  < > 1.56* 1.70* 1.85*  CALCIUM 9.6  < > 7.5* 7.6* 7.6*  PROT 6.7  --   --   --   --   BILITOT 0.6  --   --   --   --   ALKPHOS 69  --   --   --   --   ALT 33  --   --   --   --   AST 39*  --   --   --   --   GLUCOSE 214*  < > 105* 97 98  < > = values in this interval not displayed. Ct Head Wo Contrast  03/09/2013    CLINICAL DATA:  Followup, evaluate for intracranial hemorrhage for anoxia  EXAM: CT HEAD WITHOUT CONTRAST  TECHNIQUE: Contiguous axial images were obtained from the base of the skull through the vertex without intravenous contrast.  COMPARISON:  None.  FINDINGS: There has been interval development of diffuse loss of gray-white matter differentiation with loss of cortical sulcation, most consistent with diffuse anoxia and cerebral edema. There is mild crowding of the basilar cisterns without herniation. No midline shift or hydrocephalus. No intracranial hemorrhage. No extra-axial fluid collection.  Calvarium is intact.  Orbits are unchanged.  Air-fluid level noted within the right sphenoid sinus. Paranasal sinuses are otherwise clear. No mastoid effusion.  IMPRESSION: Diffuse loss of gray-white matter differentiation and cortical sulcation, worrisome for diffuse anoxia and cerebral edema. No intracranial hemorrhage identified.  Critical Value/emergent results were called by telephone at the time of interpretation on 03/28/2013 at 3:37 AM to Ashley Jones , who verbally acknowledged these results.  Electronically Signed   By: Ashley MuBenjamin  Jones M.D.   On: Oct 01, 2013 03:40   Ct Head Wo Contrast  04/01/2013   CLINICAL DATA:  Unresponsive.  EXAM: CT HEAD WITHOUT CONTRAST  TECHNIQUE: Contiguous axial images were obtained from the base of the skull through the vertex without intravenous contrast.  COMPARISON:  08/17/2010.  FINDINGS: No mass. No hydrocephalus. No hemorrhage. Stable dural calcification. Orbits unremarkable. Mild mucosal thickening in the ethmoidal and sphenoid sinuses. Mastoids are clear. No acute bony abnormality.  IMPRESSION: No acute abnormality.   Electronically Signed   By: Ashley Jones  Jones   On: 03/24/2013 10:36   Ct Angio Chest Pe W/cm &/or Wo Cm  04/02/2013   CLINICAL DATA:  Found unresponsive, fall cardiac arrest. History of atrial fibrillation deep venous thrombosis.  EXAM: CT  ANGIOGRAPHY CHEST WITH CONTRAST  TECHNIQUE: Multidetector CT imaging of the chest was performed using the standard protocol during bolus administration of intravenous contrast. Multiplanar CT image reconstructions including MIPs were obtained to evaluate the vascular anatomy.  CONTRAST:  80 mL Omnipaque  COMPARISON:  DG CHEST 1V PORT dated 03/05/2013; CT ANGIO CHEST W/CM &/OR WO/CM dated 12/20/2012  FINDINGS: There are no filling defects within the pulmonary arteries to suggest acute pulmonary embolism. No acute findings of the aorta or great vessels. No pericardial fluid. Left ventricle is enlarged.  There is dense bibasilar consolidation with air bronchograms in following a large portion of the posterior aspect of the right and left lower lobes. There is patchy airspace disease in upper lobes.  No axillary supraclavicular lymphadenopathy. Endotracheal tube is in good position the trachea. NG tube extends to stomach.  Limited view of the upper abdomen is unremarkable. Limited view of the skeleton is unremarkable.  Review of the MIP images confirms the above findings.  IMPRESSION: 1. No evidence acute pulmonary embolism. 2. Dense bilateral lower lobe consolidation represent a combination of atelectasis and airspace disease. 3. Extensive patchy airspace disease in the upper lobes may represent aspiration pneumonitis or pulmonary hemorrhage. 4. Endotracheal tube  appears in good position. 5. No pericardial fluid. Findings conveyed Ashley Jones, Ashley Jones 03/14/2013  at11:00.   Electronically Signed   By: Ashley BiStewart  Jones M.D.   On: 03/20/2013 11:01   Dg Chest Port 1 View  03/04/2013   CLINICAL DATA:  ETT position  EXAM: PORTABLE CHEST - 1 VIEW  COMPARISON:  03/09/2013  FINDINGS: Endotracheal tube in good position. Central venous catheter tip in the SVC. NG tube in the stomach. Left subclavian central venous catheter tip at the cavoatrial junction.  Increased density in both lung bases compatible with atelectasis and  effusion. Question developing mild pulmonary edema.  IMPRESSION: Increase in bibasilar atelectasis and effusion. Question developing pulmonary edema.   Electronically Signed   By: Marlan Palauharles  Clark M.D.   On: Oct 01, 2013 07:34   Portable Chest Xray In Am  03/09/2013   CLINICAL DATA:  Intubated patient.  EXAM: PORTABLE CHEST - 1 VIEW  COMPARISON:  CT chest 04/02/2013. Single view of the chest 03/26/2013.  FINDINGS: Support tubes and lines are unchanged. Bilateral airspace disease persists. Aeration appears improved. No pneumothorax or pleural effusion.  IMPRESSION: Persistent but improved bilateral airspace disease.   Electronically Signed   By: Drusilla Kannerhomas  Dalessio M.D.   On: 03/09/2013 08:05   Dg Chest Port 1 View  04/01/2013   CLINICAL DATA:  Hemodialysis catheter placement  EXAM: PORTABLE CHEST - 1 VIEW  COMPARISON:  CT ANGIO CHEST W/CM &/OR WO/CM dated 03/07/2013; DG CHEST  1V PORT dated 03/09/2013  FINDINGS: Interval placement of a large-bore right central venous line with tip in the mid SVC. No pneumothorax. Left central venous line and NG tube are unchanged. Endotracheal to is 3.7 cm from carina in good position.  There is dense bibasilar airspace disease again demonstrated  IMPRESSION: 1. Interval placement of a large-bore right central venous line with tip in the mid SVC. No pneumothorax. 2. Additional support apparatus is stable. 3. Dense bibasilar airspace disease   Electronically Signed   By: Ashley Bi M.D.   On: 03/21/2013 15:23   Dg Chest Portable 1 View  03/20/2013   CLINICAL DATA:  Cardiac arrest.  EXAM: PORTABLE CHEST - 1 VIEW  COMPARISON:  Two-view chest 01/09/2013.  FINDINGS: The heart is enlarged. Diffuse interstitial and airspace edema is present. The patient is intubated. The endotracheal tube terminates 4.2 cm above the chronic, in satisfactory position. A left subclavian line is in place. The tip is at or just above the cavoatrial junction. An NG tube courses off the inferior border of the  film. A defibrillator or pacing pad is present over the heart. A small right pleural effusion is suspected. The visualized soft tissues and bony thorax are unremarkable.  IMPRESSION: 1. Stable cardiomegaly with progressive interstitial and airspace disease likely representing edema. 2. Satisfactory positioning of the support apparatus as described. 3. Probable right pleural effusion.   Electronically Signed   By: Gennette Pac M.D.   On: 03/03/2013 09:26    Assessment:  1.) Acute renal failure sec ischemic ATN +/- contrast. Received contrast for CT again 1/8.  Mild elevation in creatinine today. CVVHD, electrolytes currently normal.  2.) CHF  3). Pulmonary Edema: Improving. 4.) A.fib  5.) Metabolic and resp Acidosis: improved. Pt became alkalotic last night and vent settings were adjusted with improvement to ABG.    6.) Hypothermia: Induced  Plan:  1.) Continue CVVHD; ~ -100 -->150 ml/hr fluid removal.  2.) renal function BID  3.) Close monitoring of K, Mg, Ca, Phos with replacement     Natalia Leatherwood, DO 03/31/2013, 7:32 AM PGY-2, [place] [facility] FPTS Intern pager: [phone], text pages welcome I have seen and examined this patient and agree with plan per Dr Claiborne Billings.  Metabolically stable.  On less pressors.  Pulling 150cc/hr.  Note seizure last pm and CT showing cerebral edema.  CCM will speak with family.  I think the chance for meaningful recovery is nil.Marland Kitchen Jobin Montelongo T,MD 03/30/2013 9:50 AM

## 2013-04-03 NOTE — Procedures (Signed)
Extubation Procedure Note  Patient Details:   Name: Ashley Jones DOB: 16-Nov-1975 MRN: [id number]   Airway Documentation:  Patient was extubated after airway clearance. No O2 in use. Patient has an order to not be reintubated.  Evaluation  O2 sats: stable throughout Complications: No apparent complications Patient did tolerate procedure well. Bilateral Breath Sounds: Clear;Diminished Suctioning: Airway Yes  Clearance Coots 2013-03-21, 11:52 AM

## 2013-04-03 NOTE — Progress Notes (Addendum)
PULMONARY / CRITICAL CARE MEDICINE   Name: Ashley Jones MRN: [id number] DOB: 12-31-1975    ADMISSION DATE:  03/26/2013 CONSULTATION DATE:  03/14/2013  REFERRING MD :  EDP PRIMARY SERVICE: PCCM  CHIEF COMPLAINT:  Cardiac arrest  BRIEF PATIENT DESCRIPTION:  38 yo with AF, DVT and systolic heart failure (EF 45-50%). Admitted 1/6 after being found down on sidewalk (downtime unknown). Initial rhythm VF, shocked several times, and ACLS algorithm carried out. PEA on arrival to ER. ROSC in ER estimated 12 minutes.  SIGNIFICANT EVENTS / STUDIES:  1/6  Found down, VF, unknown down time. ROSC in ER 12 minutes 1/6  Pericardiocentesis >>> 115 ml serous fluid 1/6  TTE >>> No significant pericardial effusion suggesting pericardial effusion and tamponade was not etiology of arrest, RV dysfxn and LV dsyfxn 1/6  CT angio >>>no PE, but significant airspace disease, likely asp PNA 1/6  Hypothermia protocol started: 0920 AM 1/6  RLE US>>>prelim no DVT 1/7 - rewarming, no improvement in neuro status 1/8 - no improvement in mental status.  CT shows significant cerebral edema.  After family meeting, decided to withdraw care  LINES / TUBES: OETT 1/6 >>>1/8 OGT 1/6 >>>1/8 L [place] CVL 1/6 >>>1/8  CULTURES: 1/7  Sputum >>> 1/7  Blood >>> 1/7 Urine strep pneumo>> 1/7 Urine legionella>>  ANTIBIOTICS: Unasyn 1/6 >>>1/8  SUBJECTIVE:  The patient has completed rewarming.  CT overnight showed significant cerebral edema, consistent with anoxic brain injury.    VITAL SIGNS: Temp:  [92.7 F (33.7 C)-98.6 F (37 C)] 94.8 F (34.9 C) (01/08 1300) Pulse Rate:  [66-90] 66 (01/08 0938) Resp:  [0-35] 22 (01/08 1300) BP: (77-131)/(43-96) 77/43 mmHg (01/08 1300) SpO2:  [86 %-100 %] 86 % (01/08 1300) FiO2 (%):  [40 %-50 %] 40 % (01/08 0946) Weight:  [226 lb 10.1 oz (102.8 kg)] 226 lb 10.1 oz (102.8 kg) (01/08 0436)  HEMODYNAMICS: CVP:  [10 mmHg-13 mmHg] 11 mmHg VENTILATOR SETTINGS: Vent Mode:  [-] PRVC FiO2  (%):  [40 %-50 %] 40 % Set Rate:  [18 bmp] 18 bmp Vt Set:  [500 mL] 500 mL PEEP:  [5 cmH20-10 cmH20] 5 cmH20 Plateau Pressure:  [17 cmH20-22 cmH20] 17 cmH20 INTAKE / OUTPUT: Intake/Output     01/07 0701 - 01/08 0700 01/08 0701 - 01/09 0700   I.V. (mL/kg) 1662.6 (16.2) 181.1 (1.8)   NG/GT 156.7 89.7   IV Piggyback 405 105   Total Intake(mL/kg) 2224.2 (21.6) 375.7 (3.7)   Urine (mL/kg/hr) 20 (0)    Emesis/NG output     Other 3293 (1.3) 557 (0.8)   Total Output 3313 557   Net -1088.8 -181.3         PHYSICAL EXAMINATION: General: lying in bed, intubated HEENT: PERRL, ETT in place Neck: supple Lungs: mechanical breath sounds, scattered ronchi Heart: bradycardic, regular rhythm, distant heart sounds Abdomen: soft, non-tender, non-distended, hypoactive bowel sounds Extremities: 2+ edema bilaterally Neurologic: sedated, no spontaneous movements  LABS:  CBC  Recent Labs Lab 03/04/2013 0801 03/09/13 0440 03/09/13 0844 03/09/13 1304  WBC 4.8 27.0*  --   --   HGB 10.8* 13.0 16.3* 16.0*  HCT 33.9* 40.2 48.0* 47.0*  PLT 158 153  --   --    Coag's  Recent Labs Lab 03/17/2013 1100 03/28/2013 1700  03/09/13 1430 03/09/13 2100 03/24/2013 0400  APTT 38* 49*  < > 175* 196* 148*  INR 1.82* 2.02*  --   --   --   --   < > =  values in this interval not displayed. BMET  Recent Labs Lab 03/09/13 1920 03/09/13 2300 03/22/2013 0430  NA 138 138 138  K 4.5 4.6 4.3  CL 99 98 99  CO2 23 25 23   BUN 19 19 20   CREATININE 1.56* 1.70* 1.85*  GLUCOSE 105* 97 98   Electrolytes  Recent Labs Lab 03/09/13 0440 03/09/13 1555 03/09/13 1920 03/09/13 2300 03/22/2013 0430  CALCIUM 7.4* 7.7* 7.5* 7.6* 7.6*  MG 1.9  --   --   --  1.9  PHOS 4.9* 2.9  --   --  2.6   Sepsis Markers  Recent Labs Lab 03/12/2013 0807 03/12/2013 0905 03/19/2013 0951 03/09/13 0440 03/09/13 1136 03/16/2013 0430  LATICACIDVEN 8.01* 6.9*  --   --  3.7*  --   PROCALCITON  --   --  <0.10 29.82  --  29.87    ABG  Recent Labs Lab 03/09/13 1627 03/09/13 1820 03/09/13 1826  PHART 7.570* 7.356 7.387  PCO2ART 28.7* 45.2* 43.5  PO2ART 142.0* 106.0* 99.0   Liver Enzymes  Recent Labs Lab 03/03/2013 0801  03/09/13 0440 03/09/13 1555 03/06/2013 0430  AST 39*  --   --   --   --   ALT 33  --   --   --   --   ALKPHOS 69  --   --   --   --   BILITOT 0.6  --   --   --   --   ALBUMIN 3.2*  < > 3.4* 3.0* 2.9*  < > = values in this interval not displayed. Cardiac Enzymes  Recent Labs Lab 03/09/13 0440  TROPONINI 0.64*   Glucose  Recent Labs Lab 03/19/2013 0729 03/19/2013 0738 03/28/2013 0757 03/27/2013 0828 03/31/2013 0855 03/15/2013 1142  GLUCAP 30* 35* 32* 48* 81 44*   Imaging Ct Head Wo Contrast  03/17/2013   CLINICAL DATA:  Followup, evaluate for intracranial hemorrhage for anoxia  EXAM: CT HEAD WITHOUT CONTRAST  TECHNIQUE: Contiguous axial images were obtained from the base of the skull through the vertex without intravenous contrast.  COMPARISON:  None.  FINDINGS: There has been interval development of diffuse loss of gray-white matter differentiation with loss of cortical sulcation, most consistent with diffuse anoxia and cerebral edema. There is mild crowding of the basilar cisterns without herniation. No midline shift or hydrocephalus. No intracranial hemorrhage. No extra-axial fluid collection.  Calvarium is intact.  Orbits are unchanged.  Air-fluid level noted within the right sphenoid sinus. Paranasal sinuses are otherwise clear. No mastoid effusion.  IMPRESSION: Diffuse loss of gray-white matter differentiation and cortical sulcation, worrisome for diffuse anoxia and cerebral edema. No intracranial hemorrhage identified.  Critical Value/emergent results were called by telephone at the time of interpretation on 03/16/2013 at 3:37 AM to Dr. Lonia FarberKONSTANTIN ZUBELEVITSKIY , who verbally acknowledged these results.   Electronically Signed   By: Rise MuBenjamin  McClintock M.D.   On: 03/07/2013 03:40   Dg  Chest Port 1 View  03/04/2013   CLINICAL DATA:  ETT position  EXAM: PORTABLE CHEST - 1 VIEW  COMPARISON:  03/09/2013  FINDINGS: Endotracheal tube in good position. Central venous catheter tip in the SVC. NG tube in the stomach. Left subclavian central venous catheter tip at the cavoatrial junction.  Increased density in both lung bases compatible with atelectasis and effusion. Question developing mild pulmonary edema.  IMPRESSION: Increase in bibasilar atelectasis and effusion. Question developing pulmonary edema.   Electronically Signed   By: Marlan Palauharles  Clark M.D.  On: 03/19/2013 07:34   Portable Chest Xray In Am  03/09/2013   CLINICAL DATA:  Intubated patient.  EXAM: PORTABLE CHEST - 1 VIEW  COMPARISON:  CT chest 04/07/2013. Single view of the chest 2013/04/07.  FINDINGS: Support tubes and lines are unchanged. Bilateral airspace disease persists. Aeration appears improved. No pneumothorax or pleural effusion.  IMPRESSION: Persistent but improved bilateral airspace disease.   Electronically Signed   By: Drusilla Kanner M.D.   On: 03/09/2013 08:05   Dg Chest Port 1 View  07-Apr-2013   CLINICAL DATA:  Hemodialysis catheter placement  EXAM: PORTABLE CHEST - 1 VIEW  COMPARISON:  CT ANGIO CHEST W/CM &/OR WO/CM dated 2013-04-07; DG CHEST 1V PORT dated 2013/04/07  FINDINGS: Interval placement of a large-bore right central venous line with tip in the mid SVC. No pneumothorax. Left central venous line and NG tube are unchanged. Endotracheal to is 3.7 cm from carina in good position.  There is dense bibasilar airspace disease again demonstrated  IMPRESSION: 1. Interval placement of a large-bore right central venous line with tip in the mid SVC. No pneumothorax. 2. Additional support apparatus is stable. 3. Dense bibasilar airspace disease   Electronically Signed   By: Genevive Bi M.D.   On: 04-07-13 15:23    ASSESSMENT / PLAN: Given the CT evidence of cerebral edema, and the lack of neurologic improvement, the  patient's prognosis is exceedingly poor.  We had a long discussion with the patient's mother, husband, sister, and uncle, about the patient's prognosis.  They expressed understanding of the patient's prognosis, and stated "her body is here, but she is gone".  At this point, we have decided with the family to withdraw care. -withdrawal of care order set placed -mechanical ventilation and CVVHD discontinued -morphine drip initiated   Janalyn Harder, PGY3 Pgr. [phone] 03/28/2013, 2:01 PM  Attending:  I have seen and examined the patient with nurse practitioner/resident and agree with the note above.   I spoke with the patient's family at length today, they wish to proceed with withdrawal of life sustaining support, see note.  CC time managing vent/drips and discussing end of life issues was 40 mintues  Yolonda Kida PCCM Pager: [phone] Cell: [phone] If no response, call [phone]

## 2013-04-03 NOTE — Progress Notes (Signed)
WIS - fentanyl 180cc, versed 50cc. Witnessed by Thelma Barge, Charity fundraiser.  Jones, Ashley Elizabeth

## 2013-04-03 NOTE — Progress Notes (Signed)
30 ml Morphine gtt wasted in sink. Verified by two RN's.  Morphine waste witnessed by Floydene Flock, RN

## 2013-04-03 NOTE — Progress Notes (Signed)
eLink Physician-Brief Progress Note Patient Name: Ashley Jones DOB: October 05, 1975 MRN: [id number]  Date of Service  2013/04/05   HPI/Events of Note    Rhythmic twitching LUE Upward gaze deviation On Heparin gtt Anoxia likely Myoclonus? Seizure? ICB?     eICU Interventions   Ativan 2 x 1 Keppra 500 q12h Portable head CT EEG in am    Intervention Category Major Interventions: Seizures - evaluation and management  Ithan Touhey 2013/04/05, 12:24 AM

## 2013-04-03 NOTE — Progress Notes (Signed)
Subjective:   Patient remains intubated on vent unresponsive now on hemodialysis  Objective:  Vital Signs in the last 24 hours: Temp:  [91.4 F (33 C)-98.6 F (37 C)] 93.2 F (34 C) (01/08 1030) Pulse Rate:  [62-90] 66 (01/08 0938) Resp:  [0-35] 18 (01/08 1030) BP: (86-131)/(52-103) 86/61 mmHg (01/08 1000) SpO2:  [99 %-100 %] 100 % (01/08 1030) FiO2 (%):  [40 %-60 %] 40 % (01/08 0946) Weight:  [102.8 kg (226 lb 10.1 oz)] 102.8 kg (226 lb 10.1 oz) (01/08 0436)  Intake/Output from previous day: 01/07 0701 - 01/08 0700 In: 2224.2 [I.V.:1662.6; NG/GT:156.7; IV Piggyback:405] Out: 3313 [Urine:20] Intake/Output from this shift: Total I/O In: 264.2 [I.V.:99.2; NG/GT:60; IV Piggyback:105] Out: 557 [Other:557]  Physical Exam: Neck: no adenopathy, no carotid bruit, no JVD and supple, symmetrical, trachea midline Lungs: Bilateral course breath sounds Heart: regular rate and rhythm, S1, S2 normal and Soft systolic murmur and S3 gallop noted Abdomen: soft, non-tender; bowel sounds normal; no masses,  no organomegaly Extremities: No clubbing cyanosis 3+ edema right leg 2+ edema left leg noted  Lab Results:  Recent Labs  03/23/2013 0801 03/09/13 0440 03/09/13 0844 03/09/13 1304  WBC 4.8 27.0*  --   --   HGB 10.8* 13.0 16.3* 16.0*  PLT 158 153  --   --     Recent Labs  03/09/13 2300 07/14/2013 0430  NA 138 138  K 4.6 4.3  CL 98 99  CO2 25 23  GLUCOSE 97 98  BUN 19 20  CREATININE 1.70* 1.85*    Recent Labs  03/09/13 0440  TROPONINI 0.64*   Hepatic Function Panel  Recent Labs  03/12/2013 0801  07/14/2013 0430  PROT 6.7  --   --   ALBUMIN 3.2*  < > 2.9*  AST 39*  --   --   ALT 33  --   --   ALKPHOS 69  --   --   BILITOT 0.6  --   --   < > = values in this interval not displayed. No results found for this basename: CHOL,  in the last 72 hours No results found for this basename: PROTIME,  in the last 72 hours  Imaging: Imaging results have been reviewed and Ct Head  Wo Contrast  03/30/2013   CLINICAL DATA:  Followup, evaluate for intracranial hemorrhage for anoxia  EXAM: CT HEAD WITHOUT CONTRAST  TECHNIQUE: Contiguous axial images were obtained from the base of the skull through the vertex without intravenous contrast.  COMPARISON:  None.  FINDINGS: There has been interval development of diffuse loss of gray-white matter differentiation with loss of cortical sulcation, most consistent with diffuse anoxia and cerebral edema. There is mild crowding of the basilar cisterns without herniation. No midline shift or hydrocephalus. No intracranial hemorrhage. No extra-axial fluid collection.  Calvarium is intact.  Orbits are unchanged.  Air-fluid level noted within the right sphenoid sinus. Paranasal sinuses are otherwise clear. No mastoid effusion.  IMPRESSION: Diffuse loss of gray-white matter differentiation and cortical sulcation, worrisome for diffuse anoxia and cerebral edema. No intracranial hemorrhage identified.  Critical Value/emergent results were called by telephone at the time of interpretation on 03/30/2013 at 3:37 AM to Dr. Lonia FarberKONSTANTIN ZUBELEVITSKIY , who verbally acknowledged these results.   Electronically Signed   By: Rise MuBenjamin  McClintock M.D.   On: 2013-04-11 03:40   Dg Chest Port 1 View  03/24/2013   CLINICAL DATA:  ETT position  EXAM: PORTABLE CHEST - 1 VIEW  COMPARISON:  03/09/2013  FINDINGS: Endotracheal tube in good position. Central venous catheter tip in the SVC. NG tube in the stomach. Left subclavian central venous catheter tip at the cavoatrial junction.  Increased density in both lung bases compatible with atelectasis and effusion. Question developing mild pulmonary edema.  IMPRESSION: Increase in bibasilar atelectasis and effusion. Question developing pulmonary edema.   Electronically Signed   By: Marlan Palau M.D.   On: 04-06-2013 07:34   Portable Chest Xray In Am  03/09/2013   CLINICAL DATA:  Intubated patient.  EXAM: PORTABLE CHEST - 1 VIEW   COMPARISON:  CT chest 03/15/2013. Single view of the chest 03/26/2013.  FINDINGS: Support tubes and lines are unchanged. Bilateral airspace disease persists. Aeration appears improved. No pneumothorax or pleural effusion.  IMPRESSION: Persistent but improved bilateral airspace disease.   Electronically Signed   By: Drusilla Kanner M.D.   On: 03/09/2013 08:05   Dg Chest Port 1 View  03/17/2013   CLINICAL DATA:  Hemodialysis catheter placement  EXAM: PORTABLE CHEST - 1 VIEW  COMPARISON:  CT ANGIO CHEST W/CM &/OR WO/CM dated 03/24/2013; DG CHEST 1V PORT dated 03/18/2013  FINDINGS: Interval placement of a large-bore right central venous line with tip in the mid SVC. No pneumothorax. Left central venous line and NG tube are unchanged. Endotracheal to is 3.7 cm from carina in good position.  There is dense bibasilar airspace disease again demonstrated  IMPRESSION: 1. Interval placement of a large-bore right central venous line with tip in the mid SVC. No pneumothorax. 2. Additional support apparatus is stable. 3. Dense bibasilar airspace disease   Electronically Signed   By: Genevive Bi M.D.   On: 03/27/2013 15:23    Cardiac Studies:  Assessment/Plan:  Status post V. fib/PEA cardiac arrest  Status post emergent pericardiocentesis in ED  History of recurrent A. 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

## 2013-04-03 DEATH — deceased

## 2015-04-18 IMAGING — CR DG CHEST 1V PORT
1 series · 1 of 1 positions shown · non-contrast
Comparison: 12/22/2012

CLINICAL DATA: Cough. Evaluate pleural effusion.

EXAM:
PORTABLE CHEST - 1 VIEW

[AP]
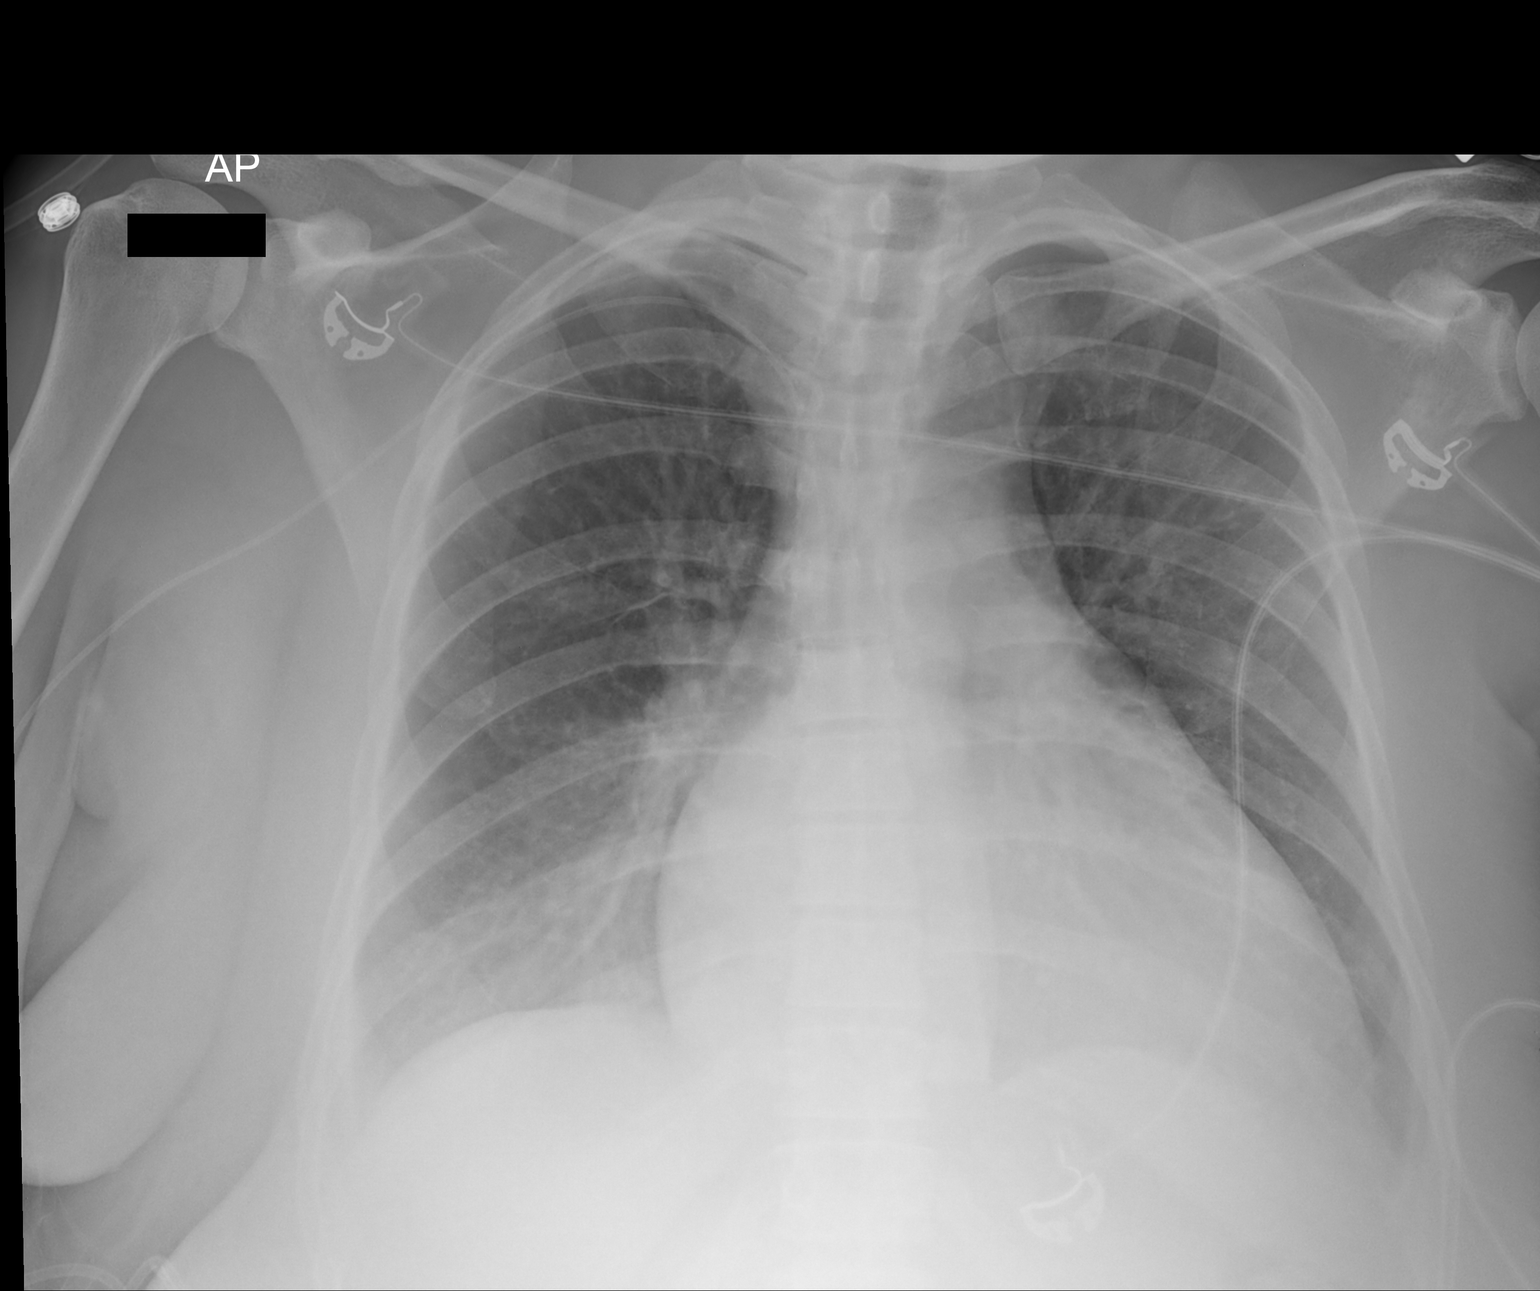

[1 of 1 positions shown; findings below may reference images not displayed]

FINDINGS: PICC line tip in the SVC region. Again noted is enlargement of the
cardiac silhouette. Again noted are hazy densities at lung bases
which may represent effusions. Upper lungs remain clear.
IMPRESSION: Persistent hazy densities at the lung bases that probably represent
pleural effusions. Minimal change from the previous examination.

Mild enlargement of the cardiac silhouette.

## 2015-07-03 IMAGING — CT CT HEAD W/O CM
2 series · 15 of 30 positions shown, 19 images · non-contrast
Comparison: None.

CLINICAL DATA: Followup, evaluate for intracranial hemorrhage for
anoxia

EXAM:
CT HEAD WITHOUT CONTRAST
TECHNIQUE: Contiguous axial images were obtained from the base of the skull
through the vertex without intravenous contrast.

[Series 2: head w/o · axial · non-contrast · 0.49mm/px · z∈[+68,+218]mm · 13 of 36 slices shown, 17 images]
[im 3/36  brain]
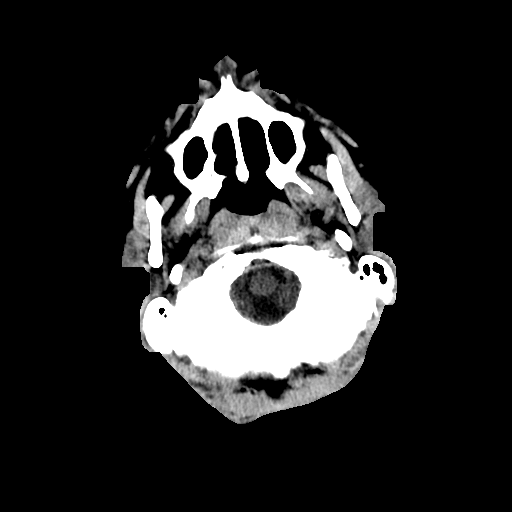
[im 3/36  bone]
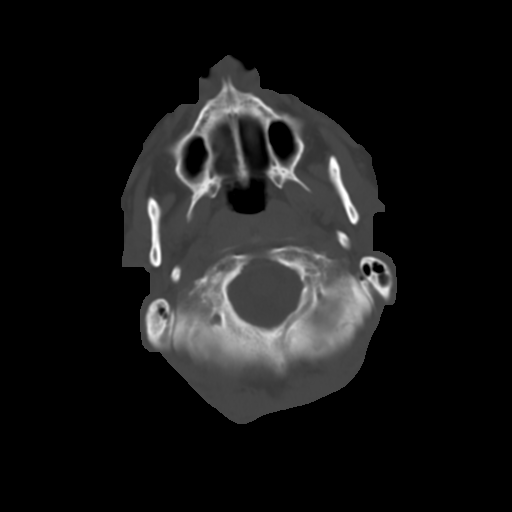
[im 6/36  brain]
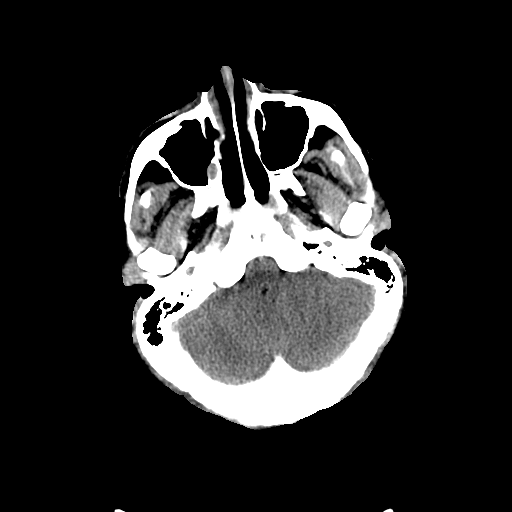
[im 8/36  brain]
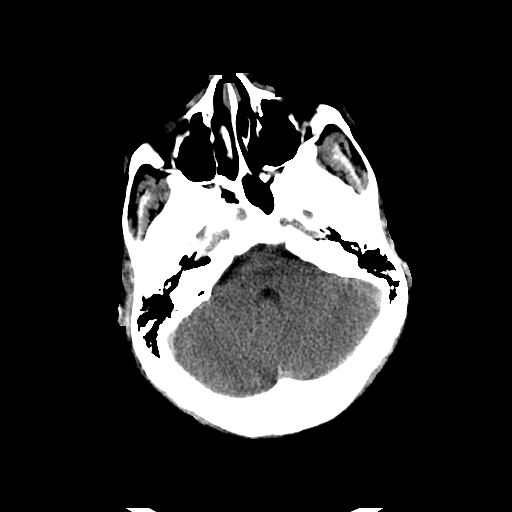
[im 11/36  brain]
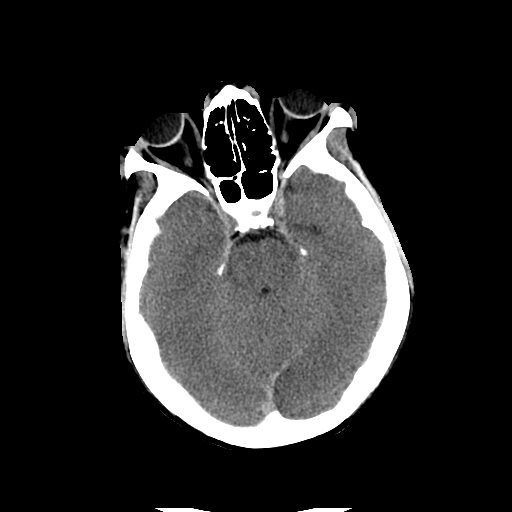
[im 13/36  brain]
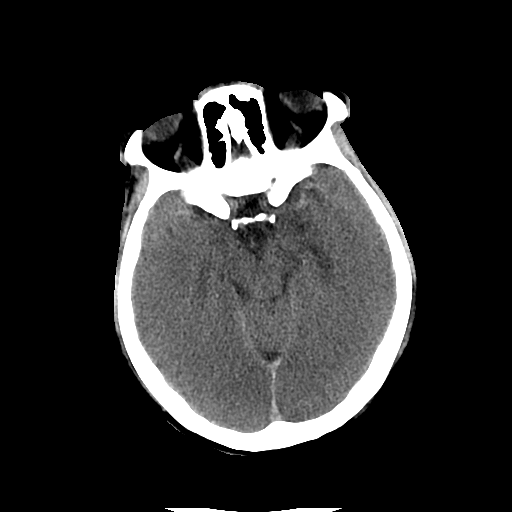
[im 13/36  bone]
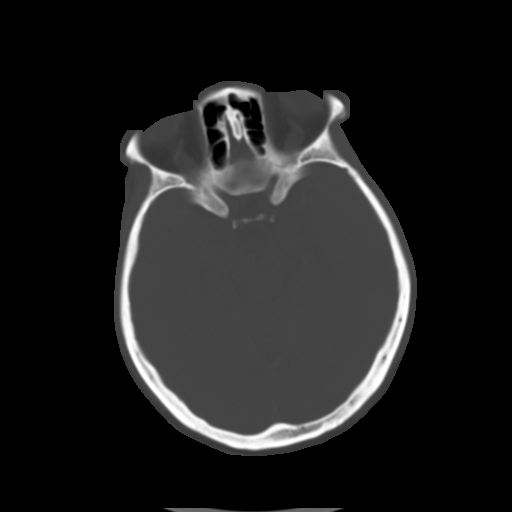
[im 16/36  brain]
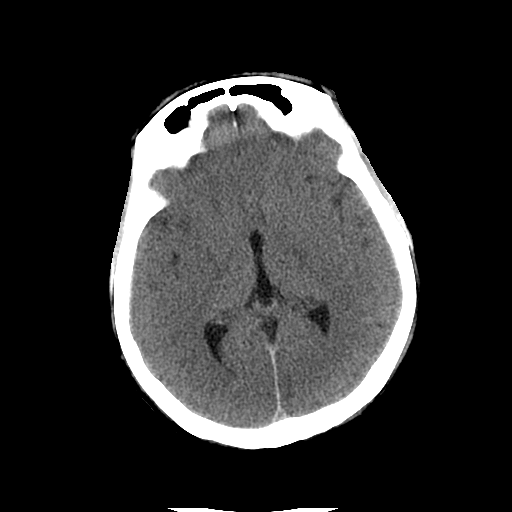
[im 18/36  brain]
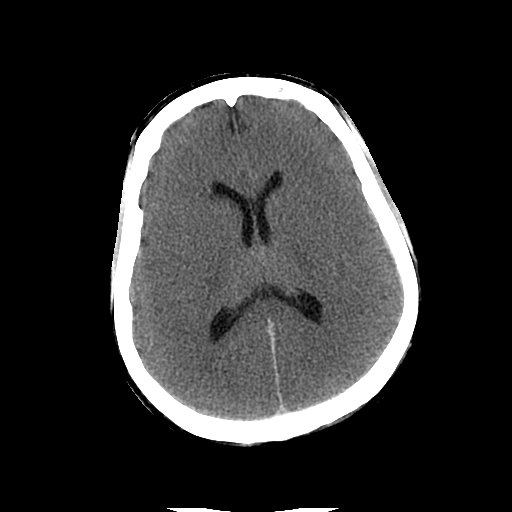
[im 21/36  brain]
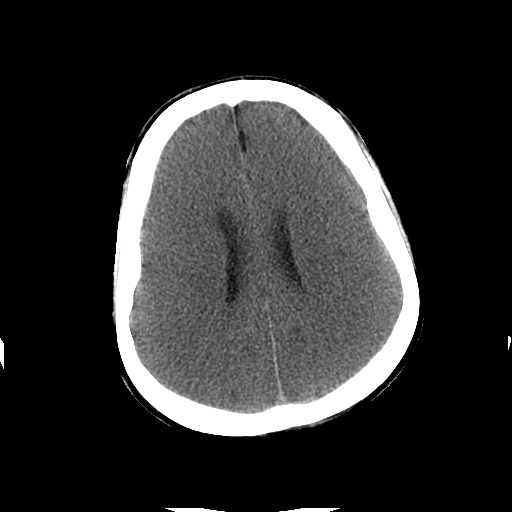
[im 23/36  brain]
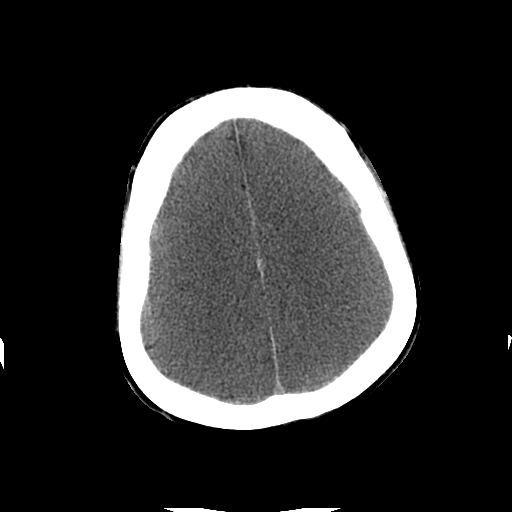
[im 23/36  bone]
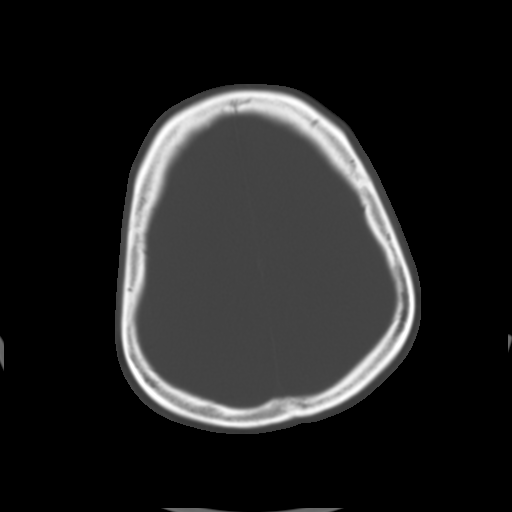
[im 26/36  brain]
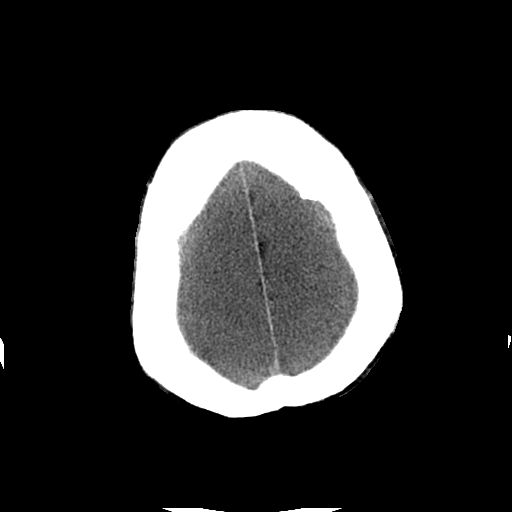
[im 28/36  brain]
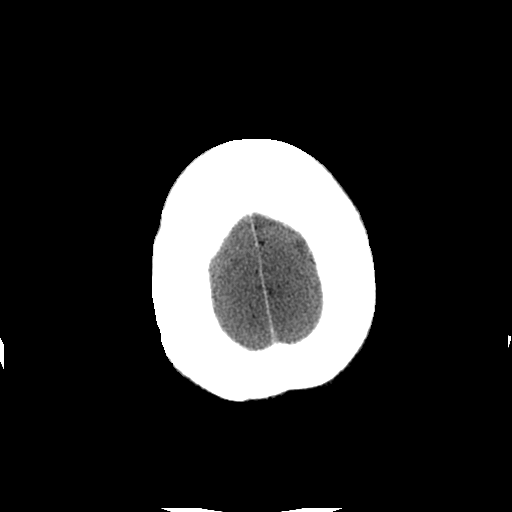
[im 31/36  brain]
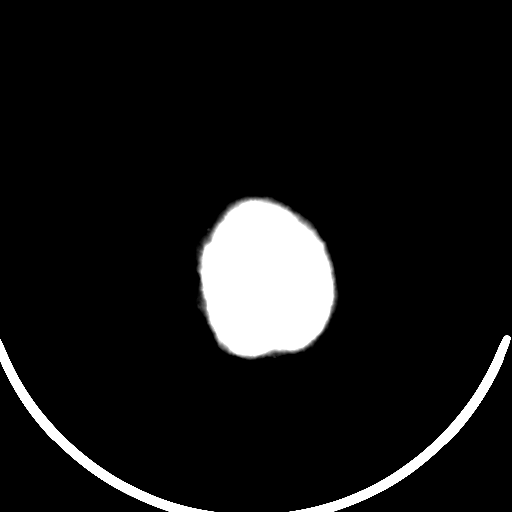
[im 33/36  brain]
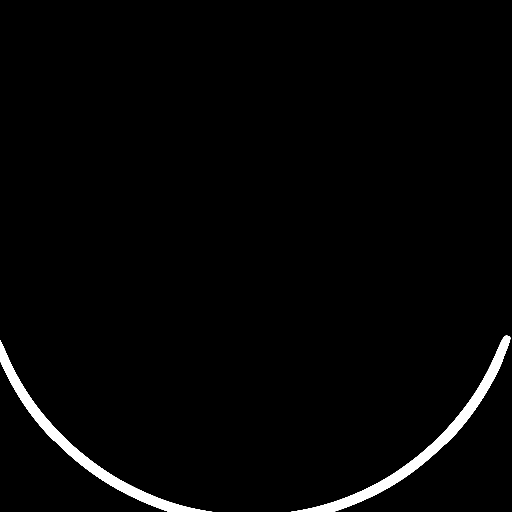
[im 33/36  bone]
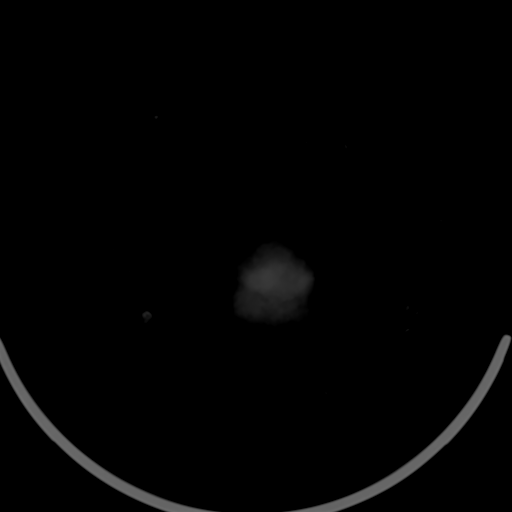

[Series 3: head w/o bone · axial · non-contrast · 0.49mm/px · z∈[+68,+93]mm · 2 of 36 slices shown]
[im 3/36  bone]
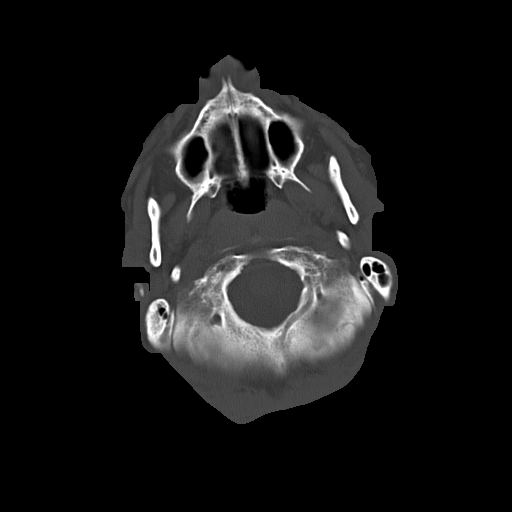
[im 8/36  bone]
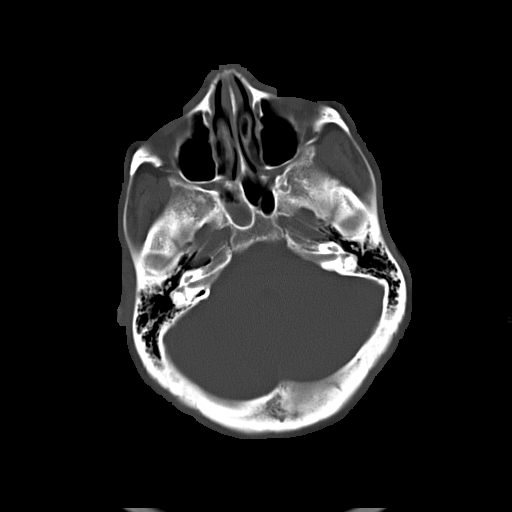

[15 of 30 positions shown; findings below may reference images not displayed]

FINDINGS: There has been interval development of diffuse loss of gray-white
matter differentiation with loss of cortical sulcation, most
consistent with diffuse anoxia and cerebral edema. There is mild
crowding of the basilar cisterns without herniation. No midline
shift or hydrocephalus. No intracranial hemorrhage. No extra-axial
fluid collection.

Calvarium is intact.  Orbits are unchanged.

Air-fluid level noted within the right sphenoid sinus. Paranasal
sinuses are otherwise clear. No mastoid effusion.
IMPRESSION: Diffuse loss of gray-white matter differentiation and cortical
sulcation, worrisome for diffuse anoxia and cerebral edema. No
intracranial hemorrhage identified.

Critical Value/emergent results were called by telephone at the time
of interpretation on 03/10/2013 at [DATE] to Dr. QUIRIJN
GUTI , who verbally acknowledged these results.

## 2015-07-03 IMAGING — DX DG CHEST 1V PORT
1 series · 1 of 1 positions shown · non-contrast
Comparison: 03/09/2013

CLINICAL DATA: ETT position

EXAM:
PORTABLE CHEST - 1 VIEW

[portable]
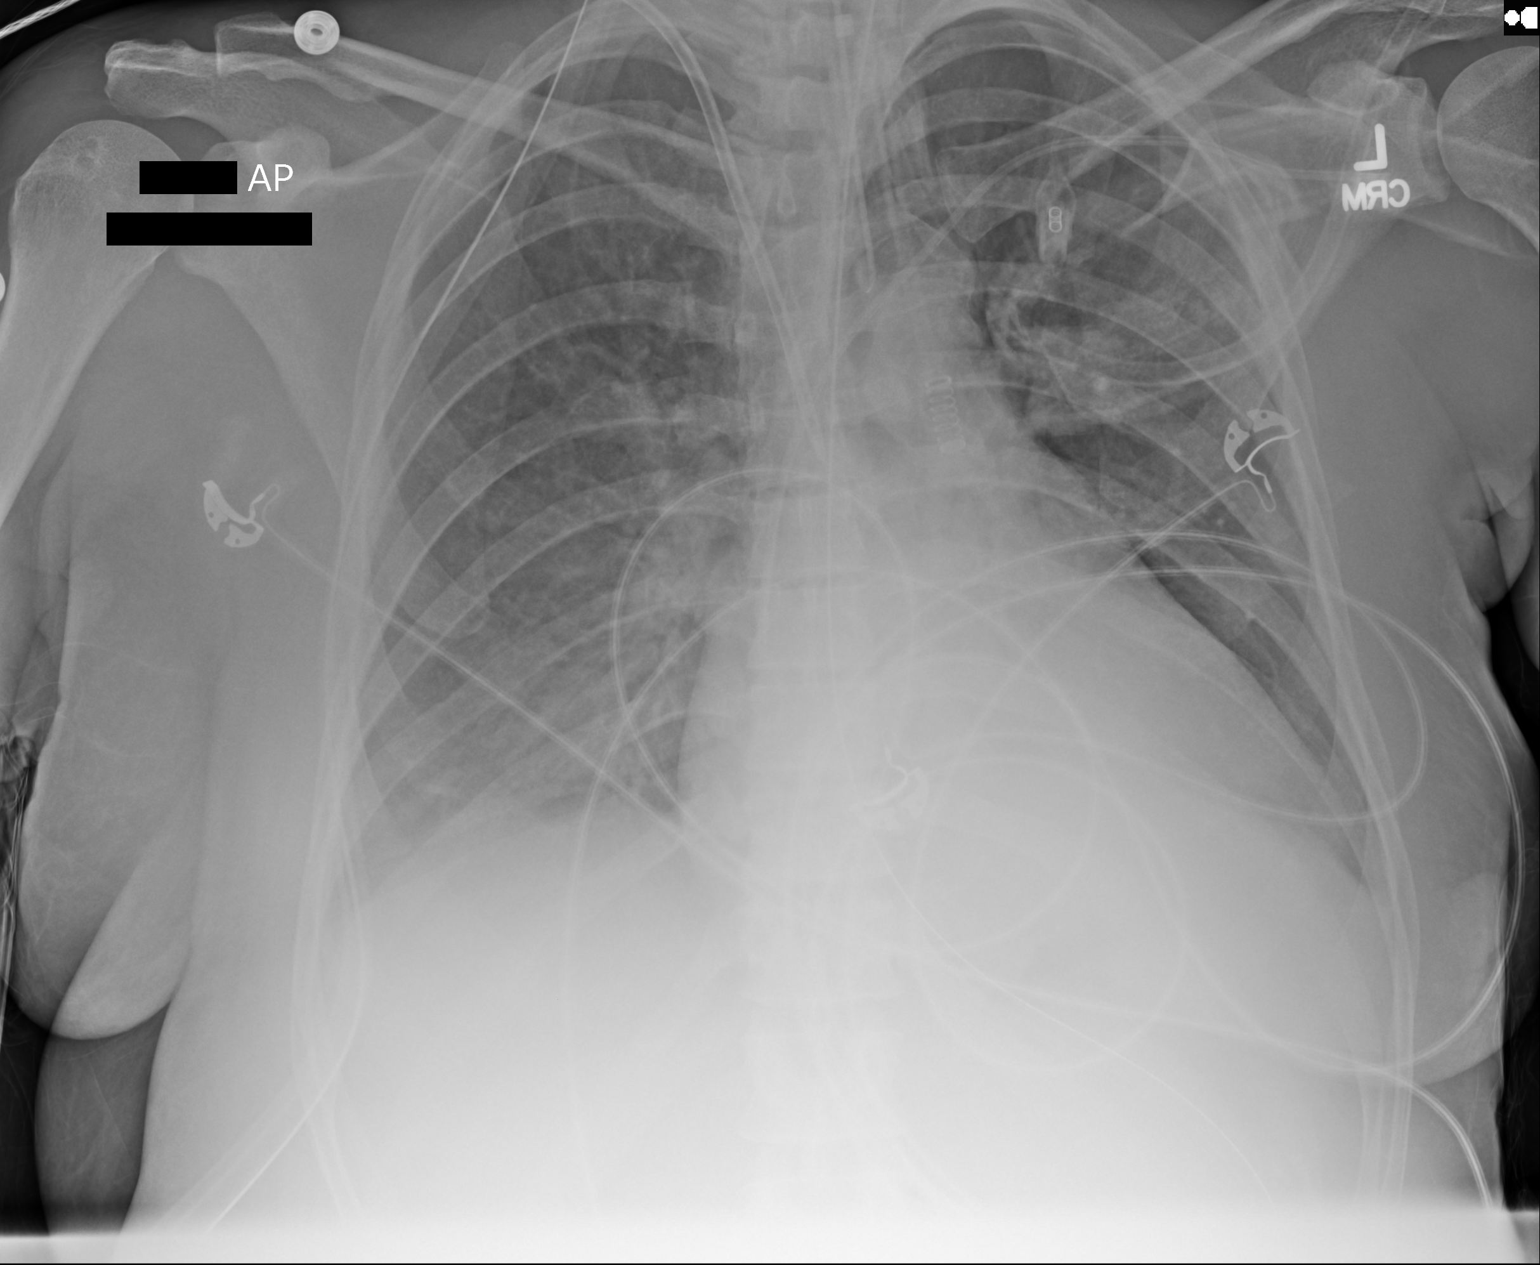

[1 of 1 positions shown; findings below may reference images not displayed]

FINDINGS: Endotracheal tube in good position. Central venous catheter tip in
the SVC. NG tube in the stomach. Left subclavian central venous
catheter tip at the cavoatrial junction.

Increased density in both lung bases compatible with atelectasis and
effusion. Question developing mild pulmonary edema.
IMPRESSION: Increase in bibasilar atelectasis and effusion. Question developing
pulmonary edema.
